# Patient Record
Sex: Female | Born: 1960
Health system: Southern US, Community
[De-identification: ages and names within clinical notes are randomized; demographics above are authoritative.]

## PROBLEM LIST (undated history)

## (undated) DIAGNOSIS — N938 Other specified abnormal uterine and vaginal bleeding: Secondary | ICD-10-CM

## (undated) DIAGNOSIS — J302 Other seasonal allergic rhinitis: Secondary | ICD-10-CM

## (undated) DIAGNOSIS — D219 Benign neoplasm of connective and other soft tissue, unspecified: Secondary | ICD-10-CM

## (undated) DIAGNOSIS — I8390 Asymptomatic varicose veins of unspecified lower extremity: Secondary | ICD-10-CM

## (undated) HISTORY — PX: LIPOMA EXCISION: SHX5283

## (undated) HISTORY — PX: DENTAL SURGERY: SHX609

---

## 1998-03-26 ENCOUNTER — Ambulatory Visit (HOSPITAL_COMMUNITY): Admission: RE | Admit: 1998-03-26 | Discharge: 1998-03-26 | Payer: Self-pay | Admitting: *Deleted

## 1998-04-02 ENCOUNTER — Ambulatory Visit (HOSPITAL_COMMUNITY): Admission: RE | Admit: 1998-04-02 | Discharge: 1998-04-02 | Payer: Self-pay | Admitting: *Deleted

## 1998-12-29 ENCOUNTER — Ambulatory Visit (HOSPITAL_COMMUNITY): Admission: RE | Admit: 1998-12-29 | Discharge: 1998-12-29 | Payer: Self-pay | Admitting: Obstetrics and Gynecology

## 1998-12-29 ENCOUNTER — Encounter: Payer: Self-pay | Admitting: Obstetrics and Gynecology

## 2000-01-27 ENCOUNTER — Other Ambulatory Visit: Admission: RE | Admit: 2000-01-27 | Discharge: 2000-01-27 | Payer: Self-pay | Admitting: Obstetrics and Gynecology

## 2000-02-03 ENCOUNTER — Ambulatory Visit (HOSPITAL_COMMUNITY): Admission: RE | Admit: 2000-02-03 | Discharge: 2000-02-03 | Payer: Self-pay | Admitting: Obstetrics and Gynecology

## 2000-02-03 ENCOUNTER — Encounter: Payer: Self-pay | Admitting: Obstetrics and Gynecology

## 2001-02-28 ENCOUNTER — Other Ambulatory Visit: Admission: RE | Admit: 2001-02-28 | Discharge: 2001-02-28 | Payer: Self-pay | Admitting: Obstetrics and Gynecology

## 2001-03-15 ENCOUNTER — Encounter: Payer: Self-pay | Admitting: Obstetrics and Gynecology

## 2001-03-15 ENCOUNTER — Ambulatory Visit (HOSPITAL_COMMUNITY): Admission: RE | Admit: 2001-03-15 | Discharge: 2001-03-15 | Payer: Self-pay | Admitting: Obstetrics and Gynecology

## 2002-04-05 ENCOUNTER — Other Ambulatory Visit: Admission: RE | Admit: 2002-04-05 | Discharge: 2002-04-05 | Payer: Self-pay | Admitting: Obstetrics and Gynecology

## 2002-04-17 ENCOUNTER — Encounter: Payer: Self-pay | Admitting: Obstetrics and Gynecology

## 2002-04-17 ENCOUNTER — Ambulatory Visit (HOSPITAL_COMMUNITY): Admission: RE | Admit: 2002-04-17 | Discharge: 2002-04-17 | Payer: Self-pay | Admitting: Obstetrics and Gynecology

## 2003-04-10 ENCOUNTER — Other Ambulatory Visit: Admission: RE | Admit: 2003-04-10 | Discharge: 2003-04-10 | Payer: Self-pay | Admitting: Obstetrics and Gynecology

## 2003-04-13 HISTORY — PX: BREAST BIOPSY: SHX20

## 2003-04-23 ENCOUNTER — Encounter: Payer: Self-pay | Admitting: Obstetrics and Gynecology

## 2003-04-23 ENCOUNTER — Ambulatory Visit (HOSPITAL_COMMUNITY): Admission: RE | Admit: 2003-04-23 | Discharge: 2003-04-23 | Payer: Self-pay | Admitting: Obstetrics and Gynecology

## 2003-05-02 ENCOUNTER — Encounter (INDEPENDENT_AMBULATORY_CARE_PROVIDER_SITE_OTHER): Payer: Self-pay | Admitting: *Deleted

## 2003-05-02 ENCOUNTER — Encounter: Payer: Self-pay | Admitting: Obstetrics and Gynecology

## 2003-05-02 ENCOUNTER — Encounter: Admission: RE | Admit: 2003-05-02 | Discharge: 2003-05-02 | Payer: Self-pay | Admitting: Obstetrics and Gynecology

## 2004-03-18 ENCOUNTER — Other Ambulatory Visit: Admission: RE | Admit: 2004-03-18 | Discharge: 2004-03-18 | Payer: Self-pay | Admitting: Obstetrics and Gynecology

## 2004-05-06 ENCOUNTER — Encounter: Admission: RE | Admit: 2004-05-06 | Discharge: 2004-05-06 | Payer: Self-pay | Admitting: Obstetrics and Gynecology

## 2005-06-01 ENCOUNTER — Encounter: Admission: RE | Admit: 2005-06-01 | Discharge: 2005-06-01 | Payer: Self-pay | Admitting: Obstetrics and Gynecology

## 2007-04-25 ENCOUNTER — Encounter: Admission: RE | Admit: 2007-04-25 | Discharge: 2007-04-25 | Payer: Self-pay | Admitting: Obstetrics and Gynecology

## 2009-08-05 ENCOUNTER — Encounter: Admission: RE | Admit: 2009-08-05 | Discharge: 2009-08-05 | Payer: Self-pay | Admitting: Obstetrics and Gynecology

## 2011-01-28 ENCOUNTER — Other Ambulatory Visit: Payer: Self-pay | Admitting: Obstetrics and Gynecology

## 2011-01-28 DIAGNOSIS — Z1231 Encounter for screening mammogram for malignant neoplasm of breast: Secondary | ICD-10-CM

## 2011-02-02 ENCOUNTER — Ambulatory Visit
Admission: RE | Admit: 2011-02-02 | Discharge: 2011-02-02 | Disposition: A | Discharge status: Home / Self Care | Payer: Commercial Managed Care - PPO | Source: Ambulatory Visit | Attending: Obstetrics and Gynecology | Admitting: Obstetrics and Gynecology

## 2011-02-02 DIAGNOSIS — Z1231 Encounter for screening mammogram for malignant neoplasm of breast: Secondary | ICD-10-CM

## 2012-01-24 ENCOUNTER — Encounter (HOSPITAL_COMMUNITY)
Admission: RE | Disposition: A | Discharge status: Home / Self Care | Payer: Self-pay | Source: Ambulatory Visit | Attending: Gastroenterology

## 2012-01-24 ENCOUNTER — Ambulatory Visit (HOSPITAL_COMMUNITY)
Admission: RE | Admit: 2012-01-24 | Discharge: 2012-01-24 | Disposition: A | Discharge status: Home / Self Care | Payer: 59 | Source: Ambulatory Visit | Attending: Gastroenterology | Admitting: Gastroenterology

## 2012-01-24 ENCOUNTER — Encounter (HOSPITAL_COMMUNITY): Payer: Self-pay | Admitting: *Deleted

## 2012-01-24 DIAGNOSIS — K6389 Other specified diseases of intestine: Secondary | ICD-10-CM | POA: Insufficient documentation

## 2012-01-24 DIAGNOSIS — Z1211 Encounter for screening for malignant neoplasm of colon: Secondary | ICD-10-CM | POA: Insufficient documentation

## 2012-01-24 HISTORY — DX: Asymptomatic varicose veins of unspecified lower extremity: I83.90

## 2012-01-24 HISTORY — DX: Benign neoplasm of connective and other soft tissue, unspecified: D21.9

## 2012-01-24 HISTORY — DX: Other seasonal allergic rhinitis: J30.2

## 2012-01-24 HISTORY — PX: COLONOSCOPY: SHX5424

## 2012-01-24 SURGERY — COLONOSCOPY
Anesthesia: Moderate Sedation

## 2012-01-24 MED ORDER — MIDAZOLAM HCL 10 MG/2ML IJ SOLN
INTRAMUSCULAR | Status: AC
  Filled 2012-01-24: qty 4

## 2012-01-24 MED ORDER — FENTANYL CITRATE 0.05 MG/ML IJ SOLN
INTRAMUSCULAR | Status: DC | PRN
  Administered 2012-01-24 (×4): 25 ug via INTRAVENOUS

## 2012-01-24 MED ORDER — SODIUM CHLORIDE 0.9 % IV SOLN
Freq: Once | INTRAVENOUS | Status: AC
  Administered 2012-01-24: 500 mL via INTRAVENOUS

## 2012-01-24 MED ORDER — MIDAZOLAM HCL 10 MG/2ML IJ SOLN
INTRAMUSCULAR | Status: DC | PRN
  Administered 2012-01-24 (×4): 2 mg via INTRAVENOUS

## 2012-01-24 MED ORDER — FENTANYL CITRATE 0.05 MG/ML IJ SOLN
INTRAMUSCULAR | Status: AC
Start: 2012-01-24 — End: 2012-01-24
  Filled 2012-01-24: qty 4

## 2012-01-24 NOTE — Consult Note (Signed)
Reason for Consult: Colorectal cancer screening. Referring Physician: [name] is an 51 y.o. female.  HPI: Patient is here for a colonoscopy. She denies any GI issues at this time.  Past Medical History  Diagnosis Date  . Constipation     one bout constipation recently  . Varicose veins   . Seasonal allergies   . Fibroids     uterine  . GERD (gastroesophageal reflux disease)     distant history gerd no priblems now    Past Surgical History  Procedure Date  . Dental surgery   . Lopoma     left axilla removed    History reviewed. No pertinent family history.  Social History:  does not have a smoking history on file. She does not have any smokeless tobacco history on file. She reports that she drinks alcohol. She reports that she does not use illicit drugs.  Allergies: No Known Allergies  Medications: I have reviewed the patient's current medications.  No results found for this or any previous visit (from the past 48 hour(s)).  No results found.  Review of Systems  Constitutional: Negative for fever, chills, weight loss, malaise/fatigue and diaphoresis.  HENT: Negative for hearing loss, nosebleeds, congestion, sore throat, neck pain and ear discharge.   Eyes: Negative for blurred vision, double vision, photophobia, pain, discharge and redness.  Respiratory: Negative for cough, hemoptysis, sputum production, shortness of breath and wheezing.   Cardiovascular: Negative for chest pain, palpitations and leg swelling.  Gastrointestinal: Negative for heartburn, nausea, vomiting, abdominal pain, diarrhea, constipation, blood in stool and melena.  Genitourinary: Negative for dysuria, urgency, frequency and flank pain.  Musculoskeletal: Negative for myalgias, back pain, joint pain and falls.  Skin: Negative for itching and rash.  Neurological: Negative for dizziness, tingling, tremors, sensory change, speech change, focal weakness, seizures, loss of  consciousness, weakness and headaches.  Endo/Heme/Allergies: Negative for environmental allergies and polydipsia. Does not bruise/bleed easily.  Psychiatric/Behavioral: Negative for depression, suicidal ideas, hallucinations, memory loss and substance abuse. The patient is not nervous/anxious and does not have insomnia.    Blood pressure 121/74, pulse 74, temperature 98.1 F (36.7 C), temperature source Oral, resp. rate 9, height 5' 1.5" (1.562 m), weight 74.844 kg (165 lb), last menstrual period 01/15/2012, SpO2 100.00%. Physical Exam  Constitutional: She is oriented to person, place, and time. She appears well-developed and well-nourished.  HENT:  Head: Normocephalic and atraumatic.  Eyes: Conjunctivae and EOM are normal. Pupils are equal, round, and reactive to light.  Neck: Normal range of motion. Neck supple.  Cardiovascular: Normal rate, regular rhythm and normal heart sounds.   Respiratory: Effort normal and breath sounds normal.  GI: Soft. Bowel sounds are normal.  Musculoskeletal: Normal range of motion.  Neurological: She is alert and oriented to person, place, and time. She has normal reflexes.  Skin: Skin is warm and dry.  Psychiatric: She has a normal mood and affect. Her behavior is normal. Thought content normal.    Assessment/Plan: Colorectal cancer screening: Proceed with a colonoscopy at this time.  Kathleen Donovan 01/24/2012, 7:40 AM

## 2012-01-25 ENCOUNTER — Encounter (HOSPITAL_COMMUNITY): Payer: Self-pay | Admitting: Gastroenterology

## 2012-01-25 NOTE — Op Note (Signed)
[facility] [address] [place], [place]  [address]  OPERATIVE PROCEDURE REPORT  PATIENT:  Kathleen Donovan, Kathleen Donovan  MR#:  [id number] BIRTHDATE:  [birth year]  GENDER:  female ENDOSCOPIST:  Dr. Lorenza Burton, MD ASSISTANT:  Kandice Robinsons and Claudie Revering, RN CGRN.  PROCEDURE DATE:  01/24/2012 PRE-PROCEDURE PREPERATION:  The patient was prepped with 32 oz. of Suprep the night before the procedure and 32 oz. the morning of the procedure.  The patient was fasted for 4 hours prior to the procedure. PRE-PROCEDURE PHYSICAL:  Patient has stable vital signs. Neck is supple. There is no JVD, thyromegaly or LAD. Chest clear to auscultation. S1 and S2 regular. Abdomen soft, non-distended, non-tender with NABS. PROCEDURE:  Diagnostic colonoscopy. ASA CLASS:  Class I INDICATIONS:  1) Colorectal cancer screening, average risk MEDICATIONS:  Fentanyl 100 mcg & Versed 8 mg IV.  DESCRIPTION OF PROCEDURE: After the risks, benefits, and alternatives of the procedure were thoroughly explained [including a 10% missed rate of cancer and polyps], informed consent was obtained.  Digital rectal exam was performed.  The Pentax video colonoscope [id number] was introduced through the anus and advanced to the terminal ileum which was intubated for a short distance, limited by a redundant colon.   The quality of the prep was Suprep fairly good. Multiple washes were done. Small lesions could be missed. The instrument was then slowly withdrawn as the colon was fully examined. <<PROCEDUREIMAGES>>  FINDINGS:  There was mild melanosis coli noted in the left colon. The rest of the colonic mucosa appeared healthy with a normal vascular pattern.  No masses, polyps, diverticula or AVM's were noted. The appendiceal orifice and the ICV were identified and photographed. The terminal ileum appeared normal.  Retroflexed views revealed no abnormalities. The patient tolerated the procedure without immediate  complications. The scope was then withdrawn from the patient and the procedure terminated.  IMPRESSION:  Mild melanosis coli in the left colon; otherwise normal colonoscopy up to the terminal ileum.  RECOMMENDATIONS:  1) Continue surveillance. 2) High fiber diet with liberal fluid intake. 3) OP follow-up is advised on a PRN basis.  REPEAT EXAM:  In 10years; in case the patient has any abnormal GI symptoms in the interim, he should contact the office immediately for further recommendations.  DISCHARGE INSTRUCTIONS:  Standard discharge instructions given.  ______________________________ Dr. Lorenza Burton, MD  CPT CODES: 19147  DIAGNOSIS CODES:  V76.51  CC:  Andi Devon, M.D.  n. eSIGNED:   Dr. Lorenza Burton at 01/24/2012 08:36 AM  Arvid Right, [phone]

## 2012-09-26 ENCOUNTER — Other Ambulatory Visit: Payer: Self-pay | Admitting: Obstetrics and Gynecology

## 2012-09-26 DIAGNOSIS — Z1231 Encounter for screening mammogram for malignant neoplasm of breast: Secondary | ICD-10-CM

## 2012-10-24 ENCOUNTER — Ambulatory Visit: Payer: 59

## 2012-11-28 ENCOUNTER — Ambulatory Visit
Admission: RE | Admit: 2012-11-28 | Discharge: 2012-11-28 | Disposition: A | Discharge status: Home / Self Care | Payer: 59 | Source: Ambulatory Visit | Attending: Obstetrics and Gynecology | Admitting: Obstetrics and Gynecology

## 2012-11-28 DIAGNOSIS — Z1231 Encounter for screening mammogram for malignant neoplasm of breast: Secondary | ICD-10-CM

## 2014-03-20 ENCOUNTER — Other Ambulatory Visit: Payer: Self-pay

## 2014-03-20 DIAGNOSIS — Z1231 Encounter for screening mammogram for malignant neoplasm of breast: Secondary | ICD-10-CM

## 2014-04-09 ENCOUNTER — Encounter (INDEPENDENT_AMBULATORY_CARE_PROVIDER_SITE_OTHER): Payer: Self-pay

## 2014-04-09 ENCOUNTER — Ambulatory Visit
Admission: RE | Admit: 2014-04-09 | Discharge: 2014-04-09 | Disposition: A | Discharge status: Home / Self Care | Payer: 59 | Source: Ambulatory Visit

## 2014-04-09 DIAGNOSIS — Z1231 Encounter for screening mammogram for malignant neoplasm of breast: Secondary | ICD-10-CM

## 2015-05-13 ENCOUNTER — Other Ambulatory Visit: Payer: Self-pay

## 2015-05-13 DIAGNOSIS — Z1231 Encounter for screening mammogram for malignant neoplasm of breast: Secondary | ICD-10-CM

## 2015-06-10 ENCOUNTER — Ambulatory Visit
Admission: RE | Admit: 2015-06-10 | Discharge: 2015-06-10 | Disposition: A | Discharge status: Home / Self Care | Payer: 59 | Source: Ambulatory Visit

## 2015-06-10 DIAGNOSIS — Z1231 Encounter for screening mammogram for malignant neoplasm of breast: Secondary | ICD-10-CM

## 2016-04-07 ENCOUNTER — Other Ambulatory Visit: Payer: Self-pay

## 2016-04-07 DIAGNOSIS — Z1231 Encounter for screening mammogram for malignant neoplasm of breast: Secondary | ICD-10-CM

## 2016-06-22 ENCOUNTER — Ambulatory Visit
Admission: RE | Admit: 2016-06-22 | Discharge: 2016-06-22 | Disposition: A | Discharge status: Home / Self Care | Payer: 59 | Source: Ambulatory Visit

## 2016-06-22 DIAGNOSIS — Z1231 Encounter for screening mammogram for malignant neoplasm of breast: Secondary | ICD-10-CM

## 2016-07-20 DIAGNOSIS — Z0000 Encounter for general adult medical examination without abnormal findings: Secondary | ICD-10-CM | POA: Diagnosis not present

## 2016-07-27 DIAGNOSIS — R87610 Atypical squamous cells of undetermined significance on cytologic smear of cervix (ASC-US): Secondary | ICD-10-CM | POA: Diagnosis not present

## 2016-07-27 DIAGNOSIS — Z01419 Encounter for gynecological examination (general) (routine) without abnormal findings: Secondary | ICD-10-CM | POA: Diagnosis not present

## 2016-07-27 DIAGNOSIS — Z6832 Body mass index (BMI) 32.0-32.9, adult: Secondary | ICD-10-CM | POA: Diagnosis not present

## 2016-07-27 DIAGNOSIS — N952 Postmenopausal atrophic vaginitis: Secondary | ICD-10-CM | POA: Diagnosis not present

## 2016-07-27 DIAGNOSIS — Z124 Encounter for screening for malignant neoplasm of cervix: Secondary | ICD-10-CM | POA: Diagnosis not present

## 2016-07-27 MED FILL — ACYCLOVIR 5% OINTMENT: 5 | 7 days supply | Qty: 15 | Fill #0

## 2017-03-01 DIAGNOSIS — H5212 Myopia, left eye: Secondary | ICD-10-CM | POA: Diagnosis not present

## 2017-03-01 DIAGNOSIS — H524 Presbyopia: Secondary | ICD-10-CM | POA: Diagnosis not present

## 2017-06-21 ENCOUNTER — Other Ambulatory Visit: Payer: Self-pay | Admitting: Obstetrics and Gynecology

## 2017-06-21 DIAGNOSIS — Z1231 Encounter for screening mammogram for malignant neoplasm of breast: Secondary | ICD-10-CM

## 2017-07-05 ENCOUNTER — Ambulatory Visit
Admission: RE | Admit: 2017-07-05 | Discharge: 2017-07-05 | Disposition: A | Discharge status: Home / Self Care | Payer: 59 | Source: Ambulatory Visit | Attending: Obstetrics and Gynecology | Admitting: Obstetrics and Gynecology

## 2017-07-05 DIAGNOSIS — Z1231 Encounter for screening mammogram for malignant neoplasm of breast: Secondary | ICD-10-CM

## 2017-08-09 DIAGNOSIS — Z01411 Encounter for gynecological examination (general) (routine) with abnormal findings: Secondary | ICD-10-CM | POA: Diagnosis not present

## 2017-08-09 DIAGNOSIS — N952 Postmenopausal atrophic vaginitis: Secondary | ICD-10-CM | POA: Diagnosis not present

## 2017-08-09 DIAGNOSIS — N939 Abnormal uterine and vaginal bleeding, unspecified: Secondary | ICD-10-CM | POA: Diagnosis not present

## 2017-08-09 DIAGNOSIS — D259 Leiomyoma of uterus, unspecified: Secondary | ICD-10-CM | POA: Diagnosis not present

## 2017-08-09 DIAGNOSIS — N760 Acute vaginitis: Secondary | ICD-10-CM | POA: Diagnosis not present

## 2017-08-09 DIAGNOSIS — Q506 Other congenital malformations of fallopian tube and broad ligament: Secondary | ICD-10-CM | POA: Diagnosis not present

## 2017-08-09 DIAGNOSIS — Z6832 Body mass index (BMI) 32.0-32.9, adult: Secondary | ICD-10-CM | POA: Diagnosis not present

## 2017-08-30 DIAGNOSIS — Z0000 Encounter for general adult medical examination without abnormal findings: Secondary | ICD-10-CM | POA: Diagnosis not present

## 2017-08-30 DIAGNOSIS — Z1389 Encounter for screening for other disorder: Secondary | ICD-10-CM | POA: Diagnosis not present

## 2017-08-30 DIAGNOSIS — L509 Urticaria, unspecified: Secondary | ICD-10-CM | POA: Diagnosis not present

## 2017-08-31 DIAGNOSIS — R938 Abnormal findings on diagnostic imaging of other specified body structures: Secondary | ICD-10-CM | POA: Diagnosis not present

## 2017-08-31 DIAGNOSIS — N930 Postcoital and contact bleeding: Secondary | ICD-10-CM | POA: Diagnosis not present

## 2017-08-31 DIAGNOSIS — N939 Abnormal uterine and vaginal bleeding, unspecified: Secondary | ICD-10-CM | POA: Diagnosis not present

## 2017-08-31 DIAGNOSIS — D259 Leiomyoma of uterus, unspecified: Secondary | ICD-10-CM | POA: Diagnosis not present

## 2017-10-07 ENCOUNTER — Other Ambulatory Visit: Payer: Self-pay | Admitting: Obstetrics and Gynecology

## 2017-10-19 ENCOUNTER — Other Ambulatory Visit: Payer: Self-pay

## 2017-10-19 ENCOUNTER — Encounter (HOSPITAL_BASED_OUTPATIENT_CLINIC_OR_DEPARTMENT_OTHER): Payer: Self-pay | Admitting: *Deleted

## 2017-10-19 NOTE — Progress Notes (Signed)
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 NEEDS HEMAGLOBIN.

## 2017-10-24 DIAGNOSIS — R9389 Abnormal findings on diagnostic imaging of other specified body structures: Secondary | ICD-10-CM | POA: Diagnosis present

## 2017-10-24 DIAGNOSIS — D251 Intramural leiomyoma of uterus: Secondary | ICD-10-CM | POA: Diagnosis present

## 2017-10-24 DIAGNOSIS — Q524 Other congenital malformations of vagina: Secondary | ICD-10-CM

## 2017-10-24 NOTE — H&P (Signed)
Kathleen Donovan is an 55 y.o. female. She presents for evaluation of thickened endometrium on ultrasound with equivocal serology for menopause and intermittent vaginal bleeding  Pertinent Gynecological History: Menses: irregular spotting Bleeding: post menopausal bleeding Contraception: none DES exposure: denies Blood transfusions: none Sexually transmitted diseases: no past history Previous GYN Procedures: none  Last mammogram: normal Date: 2018 Last pap:  ASCUS neg HR HPV Date: 2017 OB History: G0, P0   Menstrual History: Menarche age: 86 No LMP recorded. Patient is perimenopausal.  Last spotting was after office procedure and then last night with cytotec application  Past Medical History:  Diagnosis Date  . Dysfunctional uterine bleeding   . Fibroids    uterine  . Seasonal allergies   . Varicose veins    BOTH LEGS    Past Surgical History:  Procedure Laterality Date  . BREAST BIOPSY Left 04/2003   U/S Core- Benign  . DENTAL SURGERY    . LIPOMA EXCISION     LEFT AXILLA REGION    History reviewed. No pertinent family history.  Social History:  reports that  has never smoked. she has never used smokeless tobacco. She reports that she drinks alcohol. She reports that she does not use drugs.  Allergies:  Allergies  Allergen Reactions  . Gluten Meal     ITCHING, RASH, STOMACH UPSET  . Latex     ITCHING    Medications Prior to Admission  Medication Sig Dispense Refill Last Dose  . b complex vitamins tablet Take 1 tablet by mouth daily.   Past Week at Unknown time  . cholecalciferol (VITAMIN D) 1000 units tablet Take 1,000 Units daily by mouth.   10/24/2017 at Unknown time  . ibuprofen (ADVIL,MOTRIN) 200 MG tablet Take 600 mg as needed by mouth.   10/24/2017 at Unknown time  . Lactobacillus (PROBIOTIC ACIDOPHILUS PO) Take daily by mouth.   10/24/2017 at Unknown time  . loratadine (CLARITIN) 10 MG tablet Take 10 mg as needed by mouth for allergies.   10/24/2017 at  Unknown time  . Multiple Vitamin (MULTIVITAMIN) capsule Take 1 capsule by mouth every morning.   Past Week at Unknown time  . fish oil-omega-3 fatty acids 1000 MG capsule Take 1 g by mouth daily.   01/23/2012 at 0800  . montelukast (SINGULAIR) 10 MG tablet Take 10 mg by mouth every morning.   Past Week at Unknown    Review of Systems  Constitutional: Negative.   HENT: Positive for tinnitus (low grade and intermittent).   Eyes: Negative.   Respiratory: Negative.   Cardiovascular: Negative.   Gastrointestinal: Negative.   Genitourinary: Negative.   Musculoskeletal: Negative.   Skin: Negative.   Neurological: Negative.   Endo/Heme/Allergies: Positive for environmental allergies.  Psychiatric/Behavioral: Negative.     Blood pressure (!) 141/72, pulse (!) 104, temperature 98.5 F (36.9 C), temperature source Oral, resp. rate 16, height 5' 1.25" (1.556 m), weight 175 lb (79.4 kg), SpO2 100 %. Physical Exam  Constitutional: She is oriented to person, place, and time. She appears well-developed and well-nourished.  HENT:  Head: Atraumatic.  Eyes: EOM are normal.  Neck: Normal range of motion. Neck supple.  Cardiovascular: Normal rate and regular rhythm.  Respiratory: Effort normal and breath sounds normal.  GI: Soft. Bowel sounds are normal.  Neurological: She is alert and oriented to person, place, and time.  Skin: Skin is warm and dry.  Psychiatric: She has a normal mood and affect.   No results found for this or  any previous visit (from the past 2160 hour(s)). No results found for this or any previous visit (from the past 24 hour(s)).  Endometrial biopsy:  neg Assessment/Plan: Pt with intermittent vaginal spotting and equivocal evaluation of menopause on serology showing thickened endometrium on ultasound.  Pt is at risk fo endometrial disease due to nulliparity.  Hysteroscopy D&C recommended with explanation of indication, risks and benefits.  Pt accepts and wishes to  proceed.  Kathleen Donovan P 10/25/2017, 9:46 AM

## 2017-10-25 ENCOUNTER — Ambulatory Visit (HOSPITAL_BASED_OUTPATIENT_CLINIC_OR_DEPARTMENT_OTHER): Payer: 59 | Admitting: Anesthesiology

## 2017-10-25 ENCOUNTER — Encounter (HOSPITAL_BASED_OUTPATIENT_CLINIC_OR_DEPARTMENT_OTHER)
Admission: RE | Disposition: A | Discharge status: Home / Self Care | Payer: Self-pay | Source: Ambulatory Visit | Attending: Obstetrics and Gynecology

## 2017-10-25 ENCOUNTER — Encounter (HOSPITAL_BASED_OUTPATIENT_CLINIC_OR_DEPARTMENT_OTHER): Payer: Self-pay | Admitting: Anesthesiology

## 2017-10-25 ENCOUNTER — Ambulatory Visit (HOSPITAL_BASED_OUTPATIENT_CLINIC_OR_DEPARTMENT_OTHER)
Admission: RE | Admit: 2017-10-25 | Discharge: 2017-10-25 | Disposition: A | Discharge status: Home / Self Care | Payer: 59 | Source: Ambulatory Visit | Attending: Obstetrics and Gynecology | Admitting: Obstetrics and Gynecology

## 2017-10-25 DIAGNOSIS — Z91040 Latex allergy status: Secondary | ICD-10-CM | POA: Diagnosis not present

## 2017-10-25 DIAGNOSIS — Z79899 Other long term (current) drug therapy: Secondary | ICD-10-CM | POA: Insufficient documentation

## 2017-10-25 DIAGNOSIS — N939 Abnormal uterine and vaginal bleeding, unspecified: Secondary | ICD-10-CM | POA: Diagnosis not present

## 2017-10-25 DIAGNOSIS — N858 Other specified noninflammatory disorders of uterus: Secondary | ICD-10-CM | POA: Diagnosis not present

## 2017-10-25 DIAGNOSIS — N840 Polyp of corpus uteri: Secondary | ICD-10-CM | POA: Insufficient documentation

## 2017-10-25 DIAGNOSIS — D259 Leiomyoma of uterus, unspecified: Secondary | ICD-10-CM | POA: Diagnosis not present

## 2017-10-25 DIAGNOSIS — R9389 Abnormal findings on diagnostic imaging of other specified body structures: Secondary | ICD-10-CM | POA: Diagnosis present

## 2017-10-25 DIAGNOSIS — Z888 Allergy status to other drugs, medicaments and biological substances status: Secondary | ICD-10-CM | POA: Insufficient documentation

## 2017-10-25 DIAGNOSIS — Q524 Other congenital malformations of vagina: Secondary | ICD-10-CM

## 2017-10-25 DIAGNOSIS — N8500 Endometrial hyperplasia, unspecified: Secondary | ICD-10-CM | POA: Diagnosis not present

## 2017-10-25 DIAGNOSIS — D251 Intramural leiomyoma of uterus: Secondary | ICD-10-CM | POA: Diagnosis present

## 2017-10-25 HISTORY — PX: DILATATION & CURRETTAGE/HYSTEROSCOPY WITH RESECTOCOPE: SHX5572

## 2017-10-25 HISTORY — DX: Other specified abnormal uterine and vaginal bleeding: N93.8

## 2017-10-25 SURGERY — DILATATION & CURETTAGE/HYSTEROSCOPY WITH RESECTOCOPE
Anesthesia: General | Site: Vagina

## 2017-10-25 MED ORDER — LIDOCAINE 2% (20 MG/ML) 5 ML SYRINGE
INTRAMUSCULAR | Status: AC
  Filled 2017-10-25: qty 5

## 2017-10-25 MED ORDER — ONDANSETRON HCL 4 MG/2ML IJ SOLN
INTRAMUSCULAR | Status: AC
  Filled 2017-10-25: qty 2

## 2017-10-25 MED ORDER — ONDANSETRON HCL 4 MG/2ML IJ SOLN
4.0000 mg | Freq: Once | INTRAMUSCULAR | Status: DC | PRN
  Filled 2017-10-25: qty 2

## 2017-10-25 MED ORDER — LIDOCAINE HCL 2 % IJ SOLN
INTRAMUSCULAR | Status: DC | PRN
  Administered 2017-10-25: 10 mL

## 2017-10-25 MED ORDER — ARTIFICIAL TEARS OPHTHALMIC OINT
TOPICAL_OINTMENT | OPHTHALMIC | Status: AC
  Filled 2017-10-25: qty 3.5

## 2017-10-25 MED ORDER — MIDAZOLAM HCL 2 MG/2ML IJ SOLN
INTRAMUSCULAR | Status: AC
  Filled 2017-10-25: qty 2

## 2017-10-25 MED ORDER — IBUPROFEN 600 MG PO TABS: ORAL_TABLET | ORAL | 0 refills | Status: DC

## 2017-10-25 MED ORDER — MIDAZOLAM HCL 5 MG/5ML IJ SOLN
INTRAMUSCULAR | Status: DC | PRN
  Administered 2017-10-25: 2 mg via INTRAVENOUS

## 2017-10-25 MED ORDER — DEXAMETHASONE SODIUM PHOSPHATE 10 MG/ML IJ SOLN
INTRAMUSCULAR | Status: AC
  Filled 2017-10-25: qty 1

## 2017-10-25 MED ORDER — LIDOCAINE 2% (20 MG/ML) 5 ML SYRINGE
INTRAMUSCULAR | Status: DC | PRN
  Administered 2017-10-25: 60 mg via INTRAVENOUS

## 2017-10-25 MED ORDER — MEPERIDINE HCL 25 MG/ML IJ SOLN
6.2500 mg | INTRAMUSCULAR | Status: DC | PRN
  Filled 2017-10-25: qty 1

## 2017-10-25 MED ORDER — LACTATED RINGERS IV SOLN
INTRAVENOUS | Status: DC
  Administered 2017-10-25 (×2): via INTRAVENOUS
  Filled 2017-10-25: qty 1000

## 2017-10-25 MED ORDER — FENTANYL CITRATE (PF) 100 MCG/2ML IJ SOLN
INTRAMUSCULAR | Status: DC | PRN
  Administered 2017-10-25 (×2): 25 ug via INTRAVENOUS
  Administered 2017-10-25: 50 ug via INTRAVENOUS

## 2017-10-25 MED ORDER — KETOROLAC TROMETHAMINE 30 MG/ML IJ SOLN
INTRAMUSCULAR | Status: DC | PRN
  Administered 2017-10-25: 30 mg via INTRAMUSCULAR
  Administered 2017-10-25: 30 mg via INTRAVENOUS

## 2017-10-25 MED ORDER — KETOROLAC TROMETHAMINE 30 MG/ML IJ SOLN
INTRAMUSCULAR | Status: AC
  Filled 2017-10-25: qty 2

## 2017-10-25 MED ORDER — SODIUM CHLORIDE 0.9 % IR SOLN
Status: DC | PRN
  Administered 2017-10-25: 3000 mL

## 2017-10-25 MED ORDER — PROPOFOL 10 MG/ML IV BOLUS
INTRAVENOUS | Status: AC
  Filled 2017-10-25: qty 20

## 2017-10-25 MED ORDER — FENTANYL CITRATE (PF) 100 MCG/2ML IJ SOLN
INTRAMUSCULAR | Status: AC
  Filled 2017-10-25: qty 2

## 2017-10-25 MED ORDER — DEXAMETHASONE SODIUM PHOSPHATE 4 MG/ML IJ SOLN
INTRAMUSCULAR | Status: DC | PRN
  Administered 2017-10-25: 10 mg via INTRAVENOUS

## 2017-10-25 MED ORDER — ONDANSETRON HCL 4 MG/2ML IJ SOLN
INTRAMUSCULAR | Status: DC | PRN
  Administered 2017-10-25: 4 mg via INTRAVENOUS

## 2017-10-25 MED ORDER — PROPOFOL 10 MG/ML IV BOLUS
INTRAVENOUS | Status: DC | PRN
  Administered 2017-10-25: 160 mg via INTRAVENOUS

## 2017-10-25 MED ORDER — HYDROMORPHONE HCL 1 MG/ML IJ SOLN
0.2500 mg | INTRAMUSCULAR | Status: DC | PRN
  Filled 2017-10-25: qty 0.5

## 2017-10-25 SURGICAL SUPPLY — 24 items
BIPOLAR CUTTING LOOP 21FR (ELECTRODE)
CANISTER SUCT 3000ML PPV (MISCELLANEOUS) ×2 IMPLANT
CATH FOLEY 2WAY  5CC 16FR SIL (CATHETERS) ×1
CATH FOLEY 2WAY 5CC 16FR SIL (CATHETERS) IMPLANT
CATH ROBINSON RED A/P 16FR (CATHETERS) ×1 IMPLANT
CLOTH BEACON ORANGE TIMEOUT ST (SAFETY) ×2 IMPLANT
COUNTER NEEDLE 1200 MAGNETIC (NEEDLE) ×1 IMPLANT
CURETTE PIPELLE ENDOMTRL SUCTN (MISCELLANEOUS) IMPLANT
DILATOR CANAL MILEX (MISCELLANEOUS) IMPLANT
GLOVE BIOGEL PI IND STRL 7.0 (GLOVE) IMPLANT
GLOVE BIOGEL PI INDICATOR 7.0 (GLOVE) ×1
GLOVE INDICATOR 7.0 STRL GRN (GLOVE) ×1 IMPLANT
GLOVE SURG SS PI 6.5 STRL IVOR (GLOVE) ×3 IMPLANT
GLOVE SURG SS PI 7.0 STRL IVOR (GLOVE) ×1 IMPLANT
GOWN STRL REUS W/TWL LRG LVL3 (GOWN DISPOSABLE) ×4 IMPLANT
KIT RM TURNOVER CYSTO AR (KITS) ×2 IMPLANT
LOOP CUTTING BIPOLAR 21FR (ELECTRODE) IMPLANT
PACK VAGINAL MINOR WOMEN LF (CUSTOM PROCEDURE TRAY) ×2 IMPLANT
PAD OB MATERNITY 4.3X12.25 (PERSONAL CARE ITEMS) ×2 IMPLANT
PIPELLE ENDOMETRIAL SUCTION CU (MISCELLANEOUS)
TOWEL OR 17X24 6PK STRL BLUE (TOWEL DISPOSABLE) ×4 IMPLANT
TUBING AQUILEX INFLOW (TUBING) ×2 IMPLANT
TUBING AQUILEX OUTFLOW (TUBING) ×2 IMPLANT
WATER STERILE IRR 500ML POUR (IV SOLUTION) ×2 IMPLANT

## 2017-10-25 NOTE — Anesthesia Preprocedure Evaluation (Signed)
Anesthesia Evaluation  Patient identified by MRN, date of birth, ID band Patient awake    Reviewed: Allergy & Precautions, NPO status , Patient's Chart, lab work & pertinent test results  Airway Mallampati: I  TM Distance: >3 FB Neck ROM: Full    Dental   Pulmonary    Pulmonary exam normal        Cardiovascular Normal cardiovascular exam     Neuro/Psych    GI/Hepatic   Endo/Other    Renal/GU      Musculoskeletal   Abdominal   Peds  Hematology   Anesthesia Other Findings   Reproductive/Obstetrics                             Anesthesia Physical Anesthesia Plan  ASA: II  Anesthesia Plan: General   Post-op Pain Management:    Induction: Intravenous  PONV Risk Score and Plan: 3 and Dexamethasone, Ondansetron, Treatment may vary due to age or medical condition and Midazolam  Airway Management Planned: LMA  Additional Equipment:   Intra-op Plan:   Post-operative Plan: Extubation in OR  Informed Consent: I have reviewed the patients History and Physical, chart, labs and discussed the procedure including the risks, benefits and alternatives for the proposed anesthesia with the patient or authorized representative who has indicated his/her understanding and acceptance.     Plan Discussed with: CRNA and Surgeon  Anesthesia Plan Comments:         Anesthesia Quick Evaluation

## 2017-10-25 NOTE — Anesthesia Procedure Notes (Signed)
Procedure Name: LMA Insertion Date/Time: 10/25/2017 9:57 AM Performed by: Lillia Abed, MD Pre-anesthesia Checklist: Patient identified, Emergency Drugs available, Suction available and Patient being monitored Patient Re-evaluated:Patient Re-evaluated prior to induction Oxygen Delivery Method: Circle system utilized Preoxygenation: Pre-oxygenation with 100% oxygen Induction Type: IV induction Ventilation: Mask ventilation without difficulty LMA: LMA inserted LMA Size: 4.0 Number of attempts: 1 Airway Equipment and Method: Bite block Placement Confirmation: positive ETCO2 Tube secured with: Tape Dental Injury: Teeth and Oropharynx as per pre-operative assessment

## 2017-10-25 NOTE — Op Note (Signed)
10/25/2017  10:30 AM  PATIENT:  Kathleen Donovan  56 y.o. female  PRE-OPERATIVE DIAGNOSIS:  Endometrial thickening on ultrasound with abnormal uterine bleeding and uterine fibroids  POST-OPERATIVE DIAGNOSIS:  Endometrial   thickening on ultrasound with abnormal uterine bleeding and uterine fibroids  PROCEDURE:  Procedure(s): DILATATION & CURETTAGE/HYSTEROSCOPY  SURGEON:  HAYGOOD,VANESSA P, MD  ASSISTANTS: None  ANESTHESIA:   general  ESTIMATED BLOOD LOSS: Minimal, less than 10 cc   BLOOD ADMINISTERED:none  COMPLICATIONS: None  FINDINGS: The uterus sounded to 7 cm. There were several polypoid lesions noted at hysteroscopy. The remainder of the endometrium contains shaggy lining. Both tubal ostia were identified.  FLUID DEFICIT: 150 cc  LOCAL MEDICATIONS USED:  XYLOCAINE  10cc   SPECIMEN:  Source of Specimen:  #1. Endometrial polyp. #2. Endometrial curettings  DISPOSITION OF SPECIMEN:  PATHOLOGY  COUNTS:  YES  DESCRIPTION OF PROCEDURE:the patient was taken to the operating room after appropriate identification and placed on the operating table. After the attainment of adequate general anesthesia she was placed in the lithotomy position. A timeout was performed.  . The perineum and vagina were prepped with multiple layers of Betadine. The bladder was emptied with a an in and out catheter. The perineum was draped in sterile field. A gray speculum was placed in the vagina. The cervix was grasped with a single-tooth tenaculum. A paracervical block was achieved with a total of 10 cc of 2% Xylocaine and the 5 and 7:00 positions. The uterus was sounded.  The cervix was then dilated to accommodate the diagnostic hysteroscope. The hysteroscope was used to evaluate all quadrants of the uterus with the above-noted findings. A Randall stone forceps was used to remove the polypoid lesions and they were sent as a separate specimen. An endometrial curet was used to curet all quadrants of the  uterus with the production of a small to moderate amount of tissue. The hysteroscope was reinserted to document no further lesions.All instruments were then removed from the vagina and the patient was awakened from general anesthesia and taken to the recovery room in satisfactory condition having tolerated the procedure well sponge and instrument counts correct.  ketorolac 30 mg IV and 30 mg IM were given during the procedure.  PLAN OF CARE: Discharge home after postanesthesia care   PATIENT DISPOSITION:  PACU - hemodynamically stable.   Delay start of Pharmacological VTE agent (>24hrs) due to surgical blood loss or risk of bleeding:  yes   SCDs were used during this entire procedure   HAYGOOD,VANESSA P, MD 10:30 AM

## 2017-10-25 NOTE — Discharge Instructions (Signed)
Hysteroscopy, Care After °Refer to this sheet in the next few weeks. These instructions provide you with information on caring for yourself after your procedure. Your health care provider may also give you more specific instructions. Your treatment has been planned according to current medical practices, but problems sometimes occur. Call your health care provider if you have any problems or questions after your procedure. °What can I expect after the procedure? °After your procedure, it is typical to have the following: °· You may have some cramping. This normally lasts for a couple days. °· You may have bleeding. This can vary from light spotting for a few days to menstrual-like bleeding for 3-7 days. ° °Follow these instructions at home: °· Rest for the first 1-2 days after the procedure. °· Only take over-the-counter or prescription medicines as directed by your health care provider. Do not take aspirin. It can increase the chances of bleeding. °· Take showers instead of baths for 2 weeks or as directed by your health care provider. °· Do not drive for 24 hours or as directed. °· Do not drink alcohol while taking pain medicine. °· Do not use tampons, douche, or have sexual intercourse for 2 weeks or until your health care provider says it is okay. °· Take your temperature twice a day for 4-5 days. Write it down each time. °· Follow your health care provider's advice about diet, exercise, and lifting. °· If you develop constipation, you may: °? Take a mild laxative if your health care provider approves. °? Add bran foods to your diet. °? Drink enough fluids to keep your urine clear or pale yellow. °· Try to have someone with you or available to you for the first 24-48 hours, especially if you were given a general anesthetic. °· Follow up with your health care provider as directed. °Contact a health care provider if: °· You feel dizzy or lightheaded. °· You feel sick to your stomach (nauseous). °· You have  abnormal vaginal discharge. °· You have a rash. °· You have pain that is not controlled with medicine. °Get help right away if: °· You have bleeding that is heavier than a normal menstrual period. °· You have a fever. °· You have increasing cramps or pain, not controlled with medicine. °· You have new belly (abdominal) pain. °· You pass out. °· You have pain in the tops of your shoulders (shoulder strap areas). °· You have shortness of breath. °This information is not intended to replace advice given to you by your health care provider. Make sure you discuss any questions you have with your health care provider. °Document Released: 09/19/2013 Document Revised: 05/06/2016 Document Reviewed: 06/28/2013 °Elsevier Interactive Patient Education © 2017 [employer] ° ° °Post Anesthesia Home Care Instructions ° °Activity: °Get plenty of rest for the remainder of the day. A responsible individual must stay with you for 24 hours following the procedure.  °For the next 24 hours, DO NOT: °-Drive a car °-Operate machinery °-Drink alcoholic beverages °-Take any medication unless instructed by your physician °-Make any legal decisions or sign important papers. ° °Meals: °Start with liquid foods such as gelatin or soup. Progress to regular foods as tolerated. Avoid greasy, spicy, heavy foods. If nausea and/or vomiting occur, drink only clear liquids until the nausea and/or vomiting subsides. Call your physician if vomiting continues. ° °Special Instructions/Symptoms: °Your throat may feel dry or sore from the anesthesia or the breathing tube placed in your throat during surgery. If this causes discomfort, gargle   with warm salt water. The discomfort should disappear within 24 hours. ° °If you had a scopolamine patch placed behind your ear for the management of post- operative nausea and/or vomiting: ° °1. The medication in the patch is effective for 72 hours, after which it should be removed.  Wrap patch in a tissue and discard in  the trash. Wash hands thoroughly with soap and water. °2. You may remove the patch earlier than 72 hours if you experience unpleasant side effects which may include dry mouth, dizziness or visual disturbances. °3. Avoid touching the patch. Wash your hands with soap and water after contact with the patch. °  ° ° °

## 2017-10-25 NOTE — Transfer of Care (Signed)
  Last Vitals:  Vitals:   10/25/17 0821 10/25/17 1041  BP: (!) 141/72   Pulse: (!) 104   Resp: 16   Temp: 36.9 C (P) 36.5 C  SpO2: 100% (P) 100%    Last Pain:  Vitals:   10/25/17 0834  TempSrc:   PainSc: 1       Patients Stated Pain Goal: 5 (10/25/17 9476)  Immediate Anesthesia Transfer of Care Note  Patient: Kathleen Donovan  Procedure(s) Performed: Procedure(s) (LRB): DILATATION & CURETTAGE/HYSTEROSCOPY (N/A)  Patient Location: PACU  Anesthesia Type: General  Level of Consciousness: awake, alert  and oriented  Airway & Oxygen Therapy: Patient Spontanous Breathing and Patient connected to nasal cannula oxygen  Post-op Assessment: Report given to PACU RN and Post -op Vital signs reviewed and stable  Post vital signs: Reviewed and stable  Complications: No apparent anesthesia complications

## 2017-10-26 ENCOUNTER — Encounter (HOSPITAL_BASED_OUTPATIENT_CLINIC_OR_DEPARTMENT_OTHER): Payer: Self-pay | Admitting: Obstetrics and Gynecology

## 2017-10-26 LAB — POCT HEMOGLOBIN-HEMACUE: Hemoglobin: 13.3 g/dL (ref 12.0–15.0)

## 2017-10-26 NOTE — Anesthesia Postprocedure Evaluation (Signed)
Anesthesia Post Note  Patient: BRONDA ALFRED  Procedure(s) Performed: DILATATION & CURETTAGE/HYSTEROSCOPY (N/A Vagina )     Patient location during evaluation: PACU Anesthesia Type: General Level of consciousness: awake and alert Pain management: pain level controlled Vital Signs Assessment: post-procedure vital signs reviewed and stable Respiratory status: spontaneous breathing, nonlabored ventilation, respiratory function stable and patient connected to nasal cannula oxygen Cardiovascular status: blood pressure returned to baseline and stable Postop Assessment: no apparent nausea or vomiting Anesthetic complications: no    Last Vitals:  Vitals:   10/25/17 1115 10/25/17 1215  BP: 111/70 121/70  Pulse: 80 73  Resp: (!) 22 20  Temp:  36.5 C  SpO2: 100% 100%    Last Pain:  Vitals:   10/25/17 1100  TempSrc:   PainSc: 0-No pain                 Naijah Lacek DAVID

## 2018-06-20 ENCOUNTER — Other Ambulatory Visit: Payer: Self-pay | Admitting: Obstetrics and Gynecology

## 2018-06-20 DIAGNOSIS — Z1231 Encounter for screening mammogram for malignant neoplasm of breast: Secondary | ICD-10-CM

## 2018-07-12 ENCOUNTER — Ambulatory Visit
Admission: RE | Admit: 2018-07-12 | Discharge: 2018-07-12 | Disposition: A | Discharge status: Home / Self Care | Payer: 59 | Source: Ambulatory Visit | Attending: Obstetrics and Gynecology | Admitting: Obstetrics and Gynecology

## 2018-07-12 DIAGNOSIS — Z1231 Encounter for screening mammogram for malignant neoplasm of breast: Secondary | ICD-10-CM | POA: Diagnosis not present

## 2018-08-16 DIAGNOSIS — R87618 Other abnormal cytological findings on specimens from cervix uteri: Secondary | ICD-10-CM | POA: Diagnosis not present

## 2018-08-16 DIAGNOSIS — N939 Abnormal uterine and vaginal bleeding, unspecified: Secondary | ICD-10-CM | POA: Diagnosis not present

## 2018-08-16 DIAGNOSIS — Z01411 Encounter for gynecological examination (general) (routine) with abnormal findings: Secondary | ICD-10-CM | POA: Diagnosis not present

## 2018-08-16 DIAGNOSIS — Z6832 Body mass index (BMI) 32.0-32.9, adult: Secondary | ICD-10-CM | POA: Diagnosis not present

## 2018-08-16 DIAGNOSIS — D259 Leiomyoma of uterus, unspecified: Secondary | ICD-10-CM | POA: Diagnosis not present

## 2018-08-16 DIAGNOSIS — Z124 Encounter for screening for malignant neoplasm of cervix: Secondary | ICD-10-CM | POA: Diagnosis not present

## 2018-08-22 ENCOUNTER — Inpatient Hospital Stay: Payer: 59 | Attending: Gynecology | Admitting: Gynecology

## 2018-08-22 ENCOUNTER — Encounter: Payer: Self-pay | Admitting: Gynecology

## 2018-08-22 VITALS — BP 122/72 | HR 86 | Temp 98.4°F | Resp 20 | Ht 61.5 in | Wt 172.7 lb

## 2018-08-22 DIAGNOSIS — N950 Postmenopausal bleeding: Secondary | ICD-10-CM | POA: Diagnosis not present

## 2018-08-22 DIAGNOSIS — N939 Abnormal uterine and vaginal bleeding, unspecified: Secondary | ICD-10-CM

## 2018-08-22 DIAGNOSIS — D259 Leiomyoma of uterus, unspecified: Secondary | ICD-10-CM | POA: Insufficient documentation

## 2018-08-22 DIAGNOSIS — Z8041 Family history of malignant neoplasm of ovary: Secondary | ICD-10-CM | POA: Insufficient documentation

## 2018-08-22 NOTE — Patient Instructions (Signed)
Plan to have an MRI of the pelvis with and without contrast to further evaluate your ovaries, the uterus, the endometrium.  We will contact you with the results and recommendations.  Please call for any questions or concerns.

## 2018-08-22 NOTE — Progress Notes (Signed)
Consult Note: Gyn-Onc   SHANIKQUA ZARZYCKI 57 y.o. female  Chief Complaint  Patient presents with  . Abnormal uterine bleeding (AUB)    Assessment : Post menopausal bleeding.  History of uterine fibroids.  Family history of ovarian cancer.  The etiology of the patient's postmenopausal bleeding is uncertain.  It could be associated with her uterine fibroids or she may have some other uterine pathology such as a sarcoma or endometrial polyp.  (I contacted Dr. Kendall Flack by phone and reviewed the patient's signs and symptoms to be certain that I was not overlooking any issues)  Plan: I think it is important to further evaluate the pelvis and would recommend an MRI with and without contrast to further delineate the uterus and the characteristics of the fibroids.  Further we would evaluate the endometrial stripe as well as the ovaries.  Follow results of the MRI I will consult with Dr. Leo Grosser to develop a more focused management plan.  I did indicate to the patient that hysterectomy and bilateral salpingo-oophorectomy may be appropriate.  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 Kendall Flack regarding management of postmenopausal bleeding.  Patient reports that she had symptoms of menopause including hot flushes and 9 months of amenorrhea when she was 71.  Subsequently, she has had intermittent bleeding sometimes bright red.  Patient is been evaluated previously and has had a normal Pap smear (ASCUS, high risk HPV negative) and in November 2018 underwent ultrasound showing uterine fibroids.  She then underwent hysteroscopy and a D&C revealing tubal metaplasia.  No hyperplasia or malignancy was found.  Subsequently the patient continued to have irregular bleeding.  She has occasional right lower quadrant pain which is very intermittent.  Otherwise she denies any other pelvic symptoms.  She denies any abdominal bloating  decreased appetite or abdominal pressure.  Functional status is good.  The patient uses estradiol vaginal cream on an irregular basis.  She is not taking any oral hormonal medication.  She has a family history of ovarian cancer in her mother who died at age 72.  There are no other immediate family members with ovarian or breast cancer.  Her father had prostate cancer.  Review of Systems:10 point review of systems is negative except as noted in interval history.   Vitals: Blood pressure 122/72, pulse 86, temperature 98.4 F (36.9 C), temperature source Oral, resp. rate 20, height 5' 1.5" (1.562 m), weight 172 lb 11.2 oz (78.3 kg), last menstrual period 01/15/2012, SpO2 100 %.  Physical Exam: General : The patient is a healthy woman in no acute distress.  HEENT: normocephalic, extraoccular movements normal; neck is supple without thyromegally  Lynphnodes: Supraclavicular and inguinal nodes not enlarged  Abdomen: Soft, non-tender, no ascites, no organomegally, no masses, no hernias  Pelvic:  EGBUS: Normal female  Vagina: Normal, there is a 3 cm Gartner's duct cyst on the right lateral vaginal sidewall. Urethra and Bladder: Normal, non-tender  Cervix: Peers normal but there is blood coming from the cervical os. Uterus: Difficult to outline secondary to the patient's habitus.  It does not seem to be extremely enlarged. Bi-manual examination: Non-tender; no adenxal masses or nodularity    Lower extremities: No edema or varicosities. Normal range of motion      Allergies  Allergen Reactions  . Gluten Meal     ITCHING, RASH, STOMACH UPSET  . Latex     ITCHING    Past Medical History:  Diagnosis  Date  . Dysfunctional uterine bleeding   . Fibroids    uterine  . Seasonal allergies   . Varicose veins    BOTH LEGS    Past Surgical History:  Procedure Laterality Date  . BREAST BIOPSY Left 04/2003   U/S Core- Benign  . COLONOSCOPY  01/24/2012   Procedure: COLONOSCOPY;  Surgeon:  Juanita Craver, MD;  Location: [facility];  Service: Endoscopy;  Laterality: N/A;  . DENTAL SURGERY    . DILATATION & CURRETTAGE/HYSTEROSCOPY WITH RESECTOCOPE N/A 10/25/2017   Procedure: DILATATION & CURETTAGE/HYSTEROSCOPY;  Surgeon: Eldred Manges, MD;  Location: [facility];  Service: Gynecology;  Laterality: N/A;  . LIPOMA EXCISION     LEFT AXILLA REGION    Current Outpatient Medications  Medication Sig Dispense Refill  . cholecalciferol (VITAMIN D) 1000 units tablet Take 2,000 Units by mouth daily.     Marland Kitchen ibuprofen (ADVIL,MOTRIN) 600 MG tablet 1 tablet orally every 6 hours for 3 days and then 1 tablet orally every 6 hours as needed for pain 60 tablet 0  . Lactobacillus (PROBIOTIC ACIDOPHILUS PO) Take daily by mouth.    . loratadine (CLARITIN) 10 MG tablet Take 10 mg as needed by mouth for allergies.    . Multiple Vitamin (MULTIVITAMIN) capsule Take 1 capsule by mouth every morning.     No current facility-administered medications for this visit.     Social History   Socioeconomic History  . Marital status: Divorced    Spouse name: Not on file  . Number of children: Not on file  . Years of education: Not on file  . Highest education level: Not on file  Occupational History  . Not on file  Social Needs  . Financial resource strain: Not on file  . Food insecurity:    Worry: Not on file    Inability: Not on file  . Transportation needs:    Medical: Not on file    Non-medical: Not on file  Tobacco Use  . Smoking status: Never Smoker  . Smokeless tobacco: Never Used  Substance and Sexual Activity  . Alcohol use: Yes    Comment: rarely  . Drug use: No  . Sexual activity: Never  Lifestyle  . Physical activity:    Days per week: Not on file    Minutes per session: Not on file  . Stress: Not on file  Relationships  . Social connections:    Talks on phone: Not on file    Gets together: Not on file    Attends religious service: Not on file    Active  member of club or organization: Not on file    Attends meetings of clubs or organizations: Not on file    Relationship status: Not on file  . Intimate partner violence:    Fear of current or ex partner: Not on file    Emotionally abused: Not on file    Physically abused: Not on file    Forced sexual activity: Not on file  Other Topics Concern  . Not on file  Social History Narrative  . Not on file    Family History  Problem Relation Age of Onset  . Ovarian cancer Mother   . Prostate cancer Father   . Hypertension Sister   . Asthma Sister   . Obesity Sister       Marti Sleigh, MD 08/22/2018, 9:38 AM

## 2018-08-29 ENCOUNTER — Ambulatory Visit (HOSPITAL_COMMUNITY)
Admission: RE | Admit: 2018-08-29 | Discharge: 2018-08-29 | Disposition: A | Discharge status: Home / Self Care | Payer: 59 | Source: Ambulatory Visit | Attending: Gynecologic Oncology | Admitting: Gynecologic Oncology

## 2018-08-29 DIAGNOSIS — D259 Leiomyoma of uterus, unspecified: Secondary | ICD-10-CM | POA: Diagnosis not present

## 2018-08-29 DIAGNOSIS — N939 Abnormal uterine and vaginal bleeding, unspecified: Secondary | ICD-10-CM | POA: Diagnosis not present

## 2018-08-29 DIAGNOSIS — N800 Endometriosis of uterus: Secondary | ICD-10-CM | POA: Insufficient documentation

## 2018-08-29 DIAGNOSIS — Q524 Other congenital malformations of vagina: Secondary | ICD-10-CM | POA: Insufficient documentation

## 2018-08-29 MED ORDER — GADOBUTROL 1 MMOL/ML IV SOLN
7.5000 mL | Freq: Once | INTRAVENOUS | Status: AC | PRN
  Administered 2018-08-29: 7 mL via INTRAVENOUS

## 2018-08-30 DIAGNOSIS — Z0000 Encounter for general adult medical examination without abnormal findings: Secondary | ICD-10-CM | POA: Insufficient documentation

## 2018-08-30 LAB — HEPATIC FUNCTION PANEL
ALT: 14 (ref 7–35)
AST: 21 (ref 13–35)
Alkaline Phosphatase: 59 (ref 25–125)
Bilirubin, Total: 0.5

## 2018-08-30 LAB — CBC AND DIFFERENTIAL
HCT: 43 (ref 36–46)
Hemoglobin: 14 (ref 12.0–16.0)
PLATELETS: 301 (ref 150–399)
WBC: 4.4

## 2018-08-30 LAB — HEMOGLOBIN A1C: Hemoglobin A1C: 5.4

## 2018-08-30 LAB — BASIC METABOLIC PANEL
BUN: 13 (ref 4–21)
Creatinine: 1 (ref 0.5–1.1)
GLUCOSE: 87
Potassium: 4.7 (ref 3.4–5.3)
Sodium: 142 (ref 137–147)

## 2018-08-30 LAB — LIPID PANEL
Cholesterol: 191 (ref 0–200)
HDL: 81 — AB (ref 35–70)
LDL CALC: 98
LDl/HDL Ratio: 1.2
Triglycerides: 89 (ref 40–160)

## 2018-08-30 LAB — VITAMIN D 25 HYDROXY (VIT D DEFICIENCY, FRACTURES): Vit D, 25-Hydroxy: 47.4

## 2018-09-05 ENCOUNTER — Telehealth: Payer: Self-pay

## 2018-09-05 NOTE — Telephone Encounter (Signed)
Told Kathleen Donovan that the endometrium is thick and an endometrial biopsy is recommended. Dr Fermin Schwab spoke with Dr. Leo Grosser and her office should be contacting her to set up an appointment.  If she has not heard from the office in a week, She needs to contact office to schedule. Pt verbalizes understanding.

## 2018-09-30 ENCOUNTER — Encounter: Payer: Self-pay | Admitting: Internal Medicine

## 2018-09-30 DIAGNOSIS — Z0000 Encounter for general adult medical examination without abnormal findings: Secondary | ICD-10-CM

## 2018-10-03 ENCOUNTER — Encounter: Payer: Self-pay | Admitting: Internal Medicine

## 2018-10-03 ENCOUNTER — Ambulatory Visit: Payer: 59 | Admitting: Internal Medicine

## 2018-10-03 VITALS — BP 118/76 | HR 89 | Temp 98.9°F | Ht 60.5 in | Wt 172.4 lb

## 2018-10-03 DIAGNOSIS — Z0000 Encounter for general adult medical examination without abnormal findings: Secondary | ICD-10-CM

## 2018-10-03 LAB — POCT URINALYSIS DIPSTICK
Bilirubin, UA: NEGATIVE
Glucose, UA: NEGATIVE
Ketones, UA: NEGATIVE
LEUKOCYTES UA: NEGATIVE
Nitrite, UA: NEGATIVE
PH UA: 7 (ref 5.0–8.0)
Protein, UA: NEGATIVE
Spec Grav, UA: 1.01 (ref 1.010–1.025)
UROBILINOGEN UA: 0.2 U/dL

## 2018-10-03 NOTE — Progress Notes (Signed)
Patient's last menstrual period was 01/15/2012..  Negative for: breast discharge, breast lump(s), breast pain and breast self exam. Associated symptoms include abnormal vaginal bleeding. Pertinent negatives include abnormal bleeding (hematology), anxiety, decreased libido, depression, difficulty falling sleep, dyspareunia, history of infertility, nocturia, sexual dysfunction, sleep disturbances, urinary incontinence, urinary urgency, vaginal discharge and vaginal itching. Diet regular.The patient states her exercise level is    . The patient's tobacco use is:  Social History   Tobacco Use  Smoking Status Never Smoker  Smokeless Tobacco Never Used  . She has been exposed to passive smoke. The patient's alcohol use is:  Social History   Substance and Sexual Activity  Alcohol Use Yes   Comment: rarely   She is followed by Dr. Leo Grosser for her gyn care. She was last seen Sept 4, 2019. She had mammogram performed at [facility] on 07/12/2018.     Subjective:     Patient ID: Kathleen Donovan , female    DOB: 1961/07/27 , 57 y.o.   MRN: [id number]   She is here today for a full physical examination.  She is followed by Dr. Leo Grosser for her gyn exams. She had her last colonoscopy in 2013.      Past Medical History:  Diagnosis Date  . Dysfunctional uterine bleeding   . Fibroids    uterine  . Seasonal allergies   . Varicose veins    BOTH LEGS      Current Outpatient Medications:  .  cholecalciferol (VITAMIN D) 1000 units tablet, Take 2,000 Units by mouth daily. , Disp: , Rfl:  .  Lactobacillus (PROBIOTIC ACIDOPHILUS PO), Take daily by mouth., Disp: , Rfl:  .  loratadine (CLARITIN) 10 MG tablet, Take 10 mg as needed by mouth for allergies., Disp: , Rfl:  .  Multiple Vitamin (MULTIVITAMIN) capsule, Take 1 capsule by mouth every morning., Disp: , Rfl:    Allergies  Allergen Reactions  . Gluten Meal     ITCHING, RASH, STOMACH UPSET  . Latex     ITCHING     Review of Systems   Constitutional: Negative.   HENT: Negative.   Eyes: Negative.   Respiratory: Negative.   Cardiovascular: Negative.   Gastrointestinal: Negative.   Genitourinary: Negative.   Musculoskeletal: Negative.   Skin: Negative.   Hematological: Negative.   Psychiatric/Behavioral: Negative.      Today's Vitals   10/03/18 0851  BP: 118/76  Pulse: 89  Temp: 98.9 F (37.2 C)  TempSrc: Oral  SpO2: 96%  Weight: 172 lb 6.4 oz (78.2 kg)  Height: 5' 0.5" (1.537 m)  PainSc: 0-No pain   Body mass index is 33.12 kg/m.   Objective:  Physical Exam  Constitutional: She is oriented to person, place, and time. She appears well-developed and well-nourished.  HENT:  Head: Normocephalic and atraumatic.  Right Ear: External ear normal.  Left Ear: External ear normal.  Nose: Nose normal.  Mouth/Throat: Oropharynx is clear and moist.  Eyes: Pupils are equal, round, and reactive to light. Conjunctivae and EOM are normal.  Neck: Normal range of motion. Neck supple.  Cardiovascular: Normal rate, regular rhythm, normal heart sounds and intact distal pulses.  Pulmonary/Chest: Effort normal and breath sounds normal. Right breast exhibits no inverted nipple, no mass, no nipple discharge, no skin change and no tenderness. Left breast exhibits no inverted nipple, no mass, no nipple discharge, no skin change and no tenderness.  Abdominal: Soft. Bowel sounds are normal.  Genitourinary:  Genitourinary Comments: deferred  Musculoskeletal: Normal  range of motion.  Neurological: She is alert and oriented to person, place, and time.  Skin: Skin is warm and dry.  Psychiatric: She has a normal mood and affect.  Nursing note and vitals reviewed.       Assessment And Plan:     1. Encounter for general adult medical examination w/o abnormal findings  A full exam was performed.  Importance of monthly self breast exams was discussed with the patient. She is encouraged to follow a low-glycemic diet, low in gluten  and to exercise no less than 150 minutes per week. She is also encouraged to stay up to date with adult immunization schedule.   - POCT Urinalysis Dipstick (81002) - CBC no Diff - CMP14+EGFR - Hemoglobin A1c - Lipid Profile - TSH        Maximino Greenland, MD

## 2018-10-04 LAB — CBC
Hematocrit: 40.6 % (ref 34.0–46.6)
Hemoglobin: 13.3 g/dL (ref 11.1–15.9)
MCH: 29.2 pg (ref 26.6–33.0)
MCHC: 32.8 g/dL (ref 31.5–35.7)
MCV: 89 fL (ref 79–97)
PLATELETS: 301 10*3/uL (ref 150–450)
RBC: 4.56 x10E6/uL (ref 3.77–5.28)
RDW: 11.5 % — AB (ref 12.3–15.4)
WBC: 4 10*3/uL (ref 3.4–10.8)

## 2018-10-04 LAB — CMP14+EGFR
A/G RATIO: 1.7 (ref 1.2–2.2)
ALT: 15 IU/L (ref 0–32)
AST: 21 IU/L (ref 0–40)
Albumin: 4 g/dL (ref 3.5–5.5)
Alkaline Phosphatase: 62 IU/L (ref 39–117)
BUN/Creatinine Ratio: 14 (ref 9–23)
BUN: 13 mg/dL (ref 6–24)
Bilirubin Total: 0.3 mg/dL (ref 0.0–1.2)
CALCIUM: 9.4 mg/dL (ref 8.7–10.2)
CO2: 24 mmol/L (ref 20–29)
CREATININE: 0.9 mg/dL (ref 0.57–1.00)
Chloride: 102 mmol/L (ref 96–106)
GFR calc Af Amer: 81 mL/min/{1.73_m2} (ref >59)
GFR calc non Af Amer: 70 mL/min/{1.73_m2} (ref >59)
GLUCOSE: 85 mg/dL (ref 65–99)
Globulin, Total: 2.3 g/dL (ref 1.5–4.5)
POTASSIUM: 4.8 mmol/L (ref 3.5–5.2)
Sodium: 140 mmol/L (ref 134–144)
Total Protein: 6.3 g/dL (ref 6.0–8.5)

## 2018-10-04 LAB — LIPID PANEL
CHOLESTEROL TOTAL: 183 mg/dL (ref 100–199)
Chol/HDL Ratio: 2.6 ratio (ref 0.0–4.4)
HDL: 70 mg/dL (ref >39)
LDL Calculated: 97 mg/dL (ref 0–99)
Triglycerides: 80 mg/dL (ref 0–149)
VLDL CHOLESTEROL CAL: 16 mg/dL (ref 5–40)

## 2018-10-04 LAB — TSH: TSH: 1.83 u[IU]/mL (ref 0.450–4.500)

## 2018-10-04 LAB — HEMOGLOBIN A1C
ESTIMATED AVERAGE GLUCOSE: 105 mg/dL
Hgb A1c MFr Bld: 5.3 % (ref 4.8–5.6)

## 2018-10-09 NOTE — Progress Notes (Signed)
Here are your lab results:  Your blood count is normal.  Your liver and kidney function are normal. You are not prediabetic. Your cholesterol is great! Lastly, your thyroid function is normal.   Sincerely,    Zunairah Devers N. [facility], MD

## 2019-06-27 ENCOUNTER — Other Ambulatory Visit: Payer: Self-pay | Admitting: Obstetrics and Gynecology

## 2019-06-27 DIAGNOSIS — Z1231 Encounter for screening mammogram for malignant neoplasm of breast: Secondary | ICD-10-CM

## 2019-08-15 ENCOUNTER — Other Ambulatory Visit: Payer: Self-pay | Admitting: Internal Medicine

## 2019-08-15 DIAGNOSIS — Z1231 Encounter for screening mammogram for malignant neoplasm of breast: Secondary | ICD-10-CM

## 2019-09-03 ENCOUNTER — Other Ambulatory Visit: Payer: Self-pay | Admitting: Internal Medicine

## 2019-09-03 DIAGNOSIS — Z1231 Encounter for screening mammogram for malignant neoplasm of breast: Secondary | ICD-10-CM

## 2019-09-05 ENCOUNTER — Other Ambulatory Visit: Payer: Self-pay

## 2019-09-05 ENCOUNTER — Ambulatory Visit
Admission: RE | Admit: 2019-09-05 | Discharge: 2019-09-05 | Disposition: A | Discharge status: Home / Self Care | Payer: 59 | Source: Ambulatory Visit | Attending: Internal Medicine | Admitting: Internal Medicine

## 2019-09-05 DIAGNOSIS — Z1231 Encounter for screening mammogram for malignant neoplasm of breast: Secondary | ICD-10-CM | POA: Diagnosis not present

## 2019-09-11 ENCOUNTER — Telehealth: Payer: Self-pay | Admitting: Internal Medicine

## 2019-09-11 DIAGNOSIS — H2513 Age-related nuclear cataract, bilateral: Secondary | ICD-10-CM | POA: Diagnosis not present

## 2019-09-11 DIAGNOSIS — H5212 Myopia, left eye: Secondary | ICD-10-CM | POA: Diagnosis not present

## 2019-09-11 DIAGNOSIS — H04123 Dry eye syndrome of bilateral lacrimal glands: Secondary | ICD-10-CM | POA: Diagnosis not present

## 2019-09-11 DIAGNOSIS — H40013 Open angle with borderline findings, low risk, bilateral: Secondary | ICD-10-CM | POA: Diagnosis not present

## 2019-09-11 NOTE — Telephone Encounter (Signed)
LMOVM that appt has been resch to 11/2 due to RS not in the office on 10/27

## 2019-10-09 ENCOUNTER — Encounter: Payer: 59 | Admitting: Internal Medicine

## 2019-10-11 ENCOUNTER — Ambulatory Visit: Payer: 59 | Admitting: Internal Medicine

## 2019-10-11 ENCOUNTER — Encounter: Payer: Self-pay | Admitting: Internal Medicine

## 2019-10-11 ENCOUNTER — Other Ambulatory Visit: Payer: Self-pay

## 2019-10-11 VITALS — BP 128/72 | Temp 98.7°F | Ht 61.2 in | Wt 178.6 lb

## 2019-10-11 DIAGNOSIS — Z6833 Body mass index (BMI) 33.0-33.9, adult: Secondary | ICD-10-CM

## 2019-10-11 DIAGNOSIS — E6609 Other obesity due to excess calories: Secondary | ICD-10-CM

## 2019-10-11 DIAGNOSIS — Z0000 Encounter for general adult medical examination without abnormal findings: Secondary | ICD-10-CM

## 2019-10-11 DIAGNOSIS — N926 Irregular menstruation, unspecified: Secondary | ICD-10-CM

## 2019-10-11 LAB — POCT URINALYSIS DIPSTICK
Bilirubin, UA: NEGATIVE
Glucose, UA: NEGATIVE
Ketones, UA: NEGATIVE
Leukocytes, UA: NEGATIVE
Nitrite, UA: NEGATIVE
Protein, UA: NEGATIVE
Spec Grav, UA: <=1.005 — AB (ref 1.010–1.025)
Urobilinogen, UA: 0.2 E.U./dL
pH, UA: 6.5 (ref 5.0–8.0)

## 2019-10-11 NOTE — Patient Instructions (Signed)
Health Maintenance, Female Adopting a healthy lifestyle and getting preventive care are important in promoting health and wellness. Ask your health care provider about:  The right schedule for you to have regular tests and exams.  Things you can do on your own to prevent diseases and keep yourself healthy. What should I know about diet, weight, and exercise? Eat a healthy diet   Eat a diet that includes plenty of vegetables, fruits, low-fat dairy products, and lean protein.  Do not eat a lot of foods that are high in solid fats, added sugars, or sodium. Maintain a healthy weight Body mass index (BMI) is used to identify weight problems. It estimates body fat based on height and weight. Your health care provider can help determine your BMI and help you achieve or maintain a healthy weight. Get regular exercise Get regular exercise. This is one of the most important things you can do for your health. Most adults should:  Exercise for at least 150 minutes each week. The exercise should increase your heart rate and make you sweat (moderate-intensity exercise).  Do strengthening exercises at least twice a week. This is in addition to the moderate-intensity exercise.  Spend less time sitting. Even light physical activity can be beneficial. Watch cholesterol and blood lipids Have your blood tested for lipids and cholesterol at 60 years of age, then have this test every 5 years. Have your cholesterol levels checked more often if:  Your lipid or cholesterol levels are high.  You are older than 60 years of age.  You are at high risk for heart disease. What should I know about cancer screening? Depending on your health history and family history, you may need to have cancer screening at various ages. This may include screening for:  Breast cancer.  Cervical cancer.  Colorectal cancer.  Skin cancer.  Lung cancer. What should I know about heart disease, diabetes, and high blood  pressure? Blood pressure and heart disease  High blood pressure causes heart disease and increases the risk of stroke. This is more likely to develop in people who have high blood pressure readings, are of African descent, or are overweight.  Have your blood pressure checked: ? Every 3-5 years if you are 18-39 years of age. ? Every year if you are 40 years old or older. Diabetes Have regular diabetes screenings. This checks your fasting blood sugar level. Have the screening done:  Once every three years after age 40 if you are at a normal weight and have a low risk for diabetes.  More often and at a younger age if you are overweight or have a high risk for diabetes. What should I know about preventing infection? Hepatitis B If you have a higher risk for hepatitis B, you should be screened for this virus. Talk with your health care provider to find out if you are at risk for hepatitis B infection. Hepatitis C Testing is recommended for:  Everyone born from 1945 through 1965.  Anyone with known risk factors for hepatitis C. Sexually transmitted infections (STIs)  Get screened for STIs, including gonorrhea and chlamydia, if: ? You are sexually active and are younger than 60 years of age. ? You are older than 60 years of age and your health care provider tells you that you are at risk for this type of infection. ? Your sexual activity has changed since you were last screened, and you are at increased risk for chlamydia or gonorrhea. Ask your health care provider if   you are at risk.  Ask your health care provider about whether you are at high risk for HIV. Your health care provider may recommend a prescription medicine to help prevent HIV infection. If you choose to take medicine to prevent HIV, you should first get tested for HIV. You should then be tested every 3 months for as long as you are taking the medicine. Pregnancy  If you are about to stop having your period (premenopausal) and  you may become pregnant, seek counseling before you get pregnant.  Take 400 to 800 micrograms (mcg) of folic acid every day if you become pregnant.  Ask for birth control (contraception) if you want to prevent pregnancy. Osteoporosis and menopause Osteoporosis is a disease in which the bones lose minerals and strength with aging. This can result in bone fractures. If you are 65 years old or older, or if you are at risk for osteoporosis and fractures, ask your health care provider if you should:  Be screened for bone loss.  Take a calcium or vitamin D supplement to lower your risk of fractures.  Be given hormone replacement therapy (HRT) to treat symptoms of menopause. Follow these instructions at home: Lifestyle  Do not use any products that contain nicotine or tobacco, such as cigarettes, e-cigarettes, and chewing tobacco. If you need help quitting, ask your health care provider.  Do not use street drugs.  Do not share needles.  Ask your health care provider for help if you need support or information about quitting drugs. Alcohol use  Do not drink alcohol if: ? Your health care provider tells you not to drink. ? You are pregnant, may be pregnant, or are planning to become pregnant.  If you drink alcohol: ? Limit how much you use to 0-1 drink a day. ? Limit intake if you are breastfeeding.  Be aware of how much alcohol is in your drink. In the [place] one drink equals one 12 oz bottle of beer (355 mL), one 5 oz glass of wine (148 mL), or one 1 oz glass of hard liquor (44 mL). General instructions  Schedule regular health, dental, and eye exams.  Stay current with your vaccines.  Tell your health care provider if: ? You often feel depressed. ? You have ever been abused or do not feel safe at home. Summary  Adopting a healthy lifestyle and getting preventive care are important in promoting health and wellness.  Follow your health care provider's instructions about healthy  diet, exercising, and getting tested or screened for diseases.  Follow your health care provider's instructions on monitoring your cholesterol and blood pressure. This information is not intended to replace advice given to you by your health care provider. Make sure you discuss any questions you have with your health care provider. Document Released: 06/14/2011 Document Revised: 11/22/2018 Document Reviewed: 11/22/2018 [employer]vier Patient Education  2020 [employer]  

## 2019-10-11 NOTE — Progress Notes (Signed)
Subjective:     Patient ID: Kathleen Donovan , female    DOB: 1961-08-05 , 58 y.o.   MRN: [id number]   Chief Complaint  Patient presents with  . Annual Exam    HPI  She is here today for a full physical examination. She has no specific concerns or complaints at this time. She was previously seen by Dr. Leo Grosser for her GYN exams; however, she has since retired. She has appt next week with Earnstine Regal, PA.     Past Medical History:  Diagnosis Date  . Dysfunctional uterine bleeding   . Fibroids    uterine  . Seasonal allergies   . Varicose veins    BOTH LEGS     Family History  Problem Relation Age of Onset  . Ovarian cancer Mother   . Prostate cancer Father   . Hypertension Sister   . Asthma Sister   . Obesity Sister      Current Outpatient Medications:  .  levocetirizine (XYZAL) 5 MG tablet, 5 mg., Disp: , Rfl:  .  cholecalciferol (VITAMIN D) 1000 units tablet, Take 2,000 Units by mouth daily. , Disp: , Rfl:  .  Multiple Vitamin (MULTIVITAMIN) capsule, Take 1 capsule by mouth every morning., Disp: , Rfl:    Allergies  Allergen Reactions  . Gluten Meal     ITCHING, RASH, STOMACH UPSET  . Latex     ITCHING     The patient states she uses none for birth control. Last LMP was Patient's last menstrual period was 01/15/2012.. Negative for Dysmenorrhea '@MAMMOFINDINGS'$ @. Negative for: breast discharge, breast lump(s), breast pain and breast self exam. Associated symptoms include abnormal vaginal bleeding. Pertinent negatives include abnormal bleeding (hematology), anxiety, decreased libido, depression, difficulty falling sleep, dyspareunia, history of infertility, nocturia, sexual dysfunction, sleep disturbances, urinary incontinence, urinary urgency, vaginal discharge and vaginal itching. Diet regular.The patient states her exercise level is    . The patient's tobacco use is:  Social History   Tobacco Use  Smoking Status Never Smoker  Smokeless Tobacco Never Used  .  She has been exposed to passive smoke. The patient's alcohol use is:  Social History   Substance and Sexual Activity  Alcohol Use Yes   Comment: rarely  . Additional information: Last pap 08/17/2019, next one scheduled for 10/16/2019.   Review of Systems  Constitutional: Negative.   HENT: Negative.   Eyes: Negative.   Respiratory: Negative.   Cardiovascular: Negative.   Endocrine: Negative.   Genitourinary: Negative.        She reports having irregular menses. She was previously followed by Dr. Leo Grosser for her GYN exams.  She has been on vaginal estriol in the past. She did have some bleeding, so she stopped the medication. She was not given progesterone. She normally has estrogen and [place] levels drawn yearly by her GYN. She plans to see Earnstine Regal, PA next week. She reports cycle stopped in 2013, but maybe she was not officially postmenopausal b/c Dr. Leo Grosser was still following her [place] levels.   Musculoskeletal: Negative.   Skin: Negative.   Allergic/Immunologic: Negative.   Neurological: Negative.   Hematological: Negative.   Psychiatric/Behavioral: Negative.      Today's Vitals   10/11/19 0830  BP: 128/72  Temp: 98.7 F (37.1 C)  TempSrc: Oral  SpO2: (!) 72%  Weight: 178 lb 9.6 oz (81 kg)  Height: 5' 1.2" (1.554 m)   Body mass index is 33.53 kg/m.   Objective:  Physical Exam Vitals  signs and nursing note reviewed.  Constitutional:      Appearance: Normal appearance. She is obese.  HENT:     Head: Normocephalic and atraumatic.     Right Ear: Tympanic membrane, ear canal and external ear normal.     Left Ear: Tympanic membrane, ear canal and external ear normal.     Nose: Nose normal.     Mouth/Throat:     Mouth: Mucous membranes are moist.     Pharynx: Oropharynx is clear.  Eyes:     Extraocular Movements: Extraocular movements intact.     Conjunctiva/sclera: Conjunctivae normal.     Pupils: Pupils are equal, round, and reactive to light.  Neck:      Musculoskeletal: Normal range of motion and neck supple.  Cardiovascular:     Rate and Rhythm: Normal rate and regular rhythm.     Pulses: Normal pulses.     Heart sounds: Normal heart sounds.  Pulmonary:     Effort: Pulmonary effort is normal.     Breath sounds: Normal breath sounds.  Chest:     Breasts: Tanner Score is 5.        Right: Normal.        Left: Normal.  Abdominal:     General: Abdomen is flat. Bowel sounds are normal.     Palpations: Abdomen is soft.  Genitourinary:    Comments: deferred Musculoskeletal: Normal range of motion.  Skin:    General: Skin is warm and dry.  Neurological:     General: No focal deficit present.     Mental Status: She is alert and oriented to person, place, and time.  Psychiatric:        Mood and Affect: Mood normal.        Behavior: Behavior normal.         Assessment And Plan:     1. Encounter for general adult medical examination w/o abnormal findings  A full exam was performed.  Importance of monthly self breast exams was discussed with the patient. PATIENT HAS BEEN ADVISED TO GET 30-45 MINUTES REGULAR EXERCISE NO LESS THAN FOUR TO FIVE DAYS PER WEEK - BOTH WEIGHTBEARING EXERCISES AND AEROBIC ARE RECOMMENDED.  SHE WAS ADVISED TO FOLLOW A HEALTHY DIET WITH AT LEAST SIX FRUITS/VEGGIES PER DAY, DECREASE INTAKE OF RED MEAT, AND TO INCREASE FISH INTAKE TO TWO DAYS PER WEEK.  MEATS/FISH SHOULD NOT BE FRIED, BAKED OR BROILED IS PREFERABLE.  I SUGGEST WEARING SPF 50 SUNSCREEN ON EXPOSED PARTS AND ESPECIALLY WHEN IN THE DIRECT SUNLIGHT FOR AN EXTENDED PERIOD OF TIME.  PLEASE AVOID FAST FOOD RESTAURANTS AND INCREASE YOUR WATER INTAKE.  - POCT Urinalysis Dipstick (81002) - CMP14+EGFR - CBC - Lipid panel   2. Menses, irregular  I will check labs as listed below. We discussed importance of eating diet full of cruciferous veggies and void of processed foods. She is also encouraged to incorporate more exercise into her daily routine. I will  make further recommendations once her results are available. We also discussed estrogen dominance and how to address it. She will also discuss further with GYN next week.   - Estradiol - Progesterone - FSH   3. Class 1 obesity due to excess calories without serious comorbidity with body mass index (BMI) of 33.0 to 33.9 in adult  Importance of achieving optimal weight to decrease risk of cardiovascular disease and cancers was discussed with the patient in full detail. She is encouraged to start slowly - start with 10 minutes  twice daily at least three to four days per week and to gradually build to 30 minutes five days weekly. She was given tips to incorporate more activity into her daily routine - take stairs when possible, park farther away from her job, grocery stores, etc.    Maximino Greenland, MD    THE PATIENT IS ENCOURAGED TO PRACTICE SOCIAL DISTANCING DUE TO THE COVID-19 PANDEMIC.

## 2019-10-15 ENCOUNTER — Encounter: Payer: 59 | Admitting: Internal Medicine

## 2019-10-16 DIAGNOSIS — Z01419 Encounter for gynecological examination (general) (routine) without abnormal findings: Secondary | ICD-10-CM | POA: Diagnosis not present

## 2019-10-16 DIAGNOSIS — Z6833 Body mass index (BMI) 33.0-33.9, adult: Secondary | ICD-10-CM | POA: Diagnosis not present

## 2019-10-19 LAB — LIPID PANEL
Chol/HDL Ratio: 2.5 ratio (ref 0.0–4.4)
Cholesterol, Total: 211 mg/dL — ABNORMAL HIGH (ref 100–199)
HDL: 84 mg/dL (ref >39)
LDL Chol Calc (NIH): 114 mg/dL — ABNORMAL HIGH (ref 0–99)
Triglycerides: 76 mg/dL (ref 0–149)
VLDL Cholesterol Cal: 13 mg/dL (ref 5–40)

## 2019-10-19 LAB — CMP14+EGFR
ALT: 18 IU/L (ref 0–32)
AST: 25 IU/L (ref 0–40)
Albumin/Globulin Ratio: 1.6 (ref 1.2–2.2)
Albumin: 4.3 g/dL (ref 3.8–4.9)
Alkaline Phosphatase: 65 IU/L (ref 39–117)
BUN/Creatinine Ratio: 16 (ref 12–28)
BUN: 15 mg/dL (ref 8–27)
Bilirubin Total: 0.5 mg/dL (ref 0.0–1.2)
CO2: 21 mmol/L (ref 20–29)
Calcium: 9.1 mg/dL (ref 8.7–10.3)
Chloride: 101 mmol/L (ref 96–106)
Creatinine, Ser: 0.94 mg/dL (ref 0.57–1.00)
GFR calc Af Amer: 76 mL/min/{1.73_m2} (ref >59)
GFR calc non Af Amer: 66 mL/min/{1.73_m2} (ref >59)
Globulin, Total: 2.7 g/dL (ref 1.5–4.5)
Glucose: 80 mg/dL (ref 65–99)
Potassium: 4 mmol/L (ref 3.5–5.2)
Sodium: 139 mmol/L (ref 134–144)
Total Protein: 7 g/dL (ref 6.0–8.5)

## 2019-10-19 LAB — FOLLICLE STIMULATING HORMONE: FSH: 39 m[IU]/mL

## 2019-10-19 LAB — ESTRADIOL, FREE
Estradiol, Serum, MS: 92 pg/mL
Free Estradiol, Percent: 1 %
Free Estradiol, Serum: 0.92 pg/mL

## 2019-10-19 LAB — CBC
Hematocrit: 42.3 % (ref 34.0–46.6)
Hemoglobin: 13.9 g/dL (ref 11.1–15.9)
MCH: 29.3 pg (ref 26.6–33.0)
MCHC: 32.9 g/dL (ref 31.5–35.7)
MCV: 89 fL (ref 79–97)
Platelets: 292 10*3/uL (ref 150–450)
RBC: 4.75 x10E6/uL (ref 3.77–5.28)
RDW: 11.9 % (ref 11.7–15.4)
WBC: 4 10*3/uL (ref 3.4–10.8)

## 2019-10-19 LAB — PROGESTERONE: Progesterone: <0.1 ng/mL

## 2020-02-20 DIAGNOSIS — H40013 Open angle with borderline findings, low risk, bilateral: Secondary | ICD-10-CM | POA: Diagnosis not present

## 2020-09-10 DIAGNOSIS — H2513 Age-related nuclear cataract, bilateral: Secondary | ICD-10-CM | POA: Diagnosis not present

## 2020-09-10 DIAGNOSIS — H04123 Dry eye syndrome of bilateral lacrimal glands: Secondary | ICD-10-CM | POA: Diagnosis not present

## 2020-09-10 DIAGNOSIS — H40013 Open angle with borderline findings, low risk, bilateral: Secondary | ICD-10-CM | POA: Diagnosis not present

## 2020-09-12 ENCOUNTER — Other Ambulatory Visit: Payer: Self-pay | Admitting: Internal Medicine

## 2020-09-12 DIAGNOSIS — Z1231 Encounter for screening mammogram for malignant neoplasm of breast: Secondary | ICD-10-CM

## 2020-09-20 ENCOUNTER — Ambulatory Visit (INDEPENDENT_AMBULATORY_CARE_PROVIDER_SITE_OTHER): Payer: 59

## 2020-09-20 DIAGNOSIS — Z23 Encounter for immunization: Secondary | ICD-10-CM | POA: Diagnosis not present

## 2020-09-30 ENCOUNTER — Ambulatory Visit: Payer: 59

## 2020-10-21 ENCOUNTER — Other Ambulatory Visit: Payer: Self-pay

## 2020-10-21 ENCOUNTER — Ambulatory Visit: Payer: 59 | Admitting: Internal Medicine

## 2020-10-21 ENCOUNTER — Encounter: Payer: Self-pay | Admitting: Internal Medicine

## 2020-10-21 VITALS — BP 112/74 | HR 86 | Temp 98.3°F | Ht 61.0 in | Wt 173.6 lb

## 2020-10-21 DIAGNOSIS — E6609 Other obesity due to excess calories: Secondary | ICD-10-CM

## 2020-10-21 DIAGNOSIS — N926 Irregular menstruation, unspecified: Secondary | ICD-10-CM

## 2020-10-21 DIAGNOSIS — Z6832 Body mass index (BMI) 32.0-32.9, adult: Secondary | ICD-10-CM | POA: Diagnosis not present

## 2020-10-21 DIAGNOSIS — Z0000 Encounter for general adult medical examination without abnormal findings: Secondary | ICD-10-CM

## 2020-10-21 DIAGNOSIS — E559 Vitamin D deficiency, unspecified: Secondary | ICD-10-CM | POA: Diagnosis not present

## 2020-10-21 NOTE — Patient Instructions (Signed)
Health Maintenance, Female Adopting a healthy lifestyle and getting preventive care are important in promoting health and wellness. Ask your health care provider about:  The right schedule for you to have regular tests and exams.  Things you can do on your own to prevent diseases and keep yourself healthy. What should I know about diet, weight, and exercise? Eat a healthy diet   Eat a diet that includes plenty of vegetables, fruits, low-fat dairy products, and lean protein.  Do not eat a lot of foods that are high in solid fats, added sugars, or sodium. Maintain a healthy weight Body mass index (BMI) is used to identify weight problems. It estimates body fat based on height and weight. Your health care provider can help determine your BMI and help you achieve or maintain a healthy weight. Get regular exercise Get regular exercise. This is one of the most important things you can do for your health. Most adults should:  Exercise for at least 150 minutes each week. The exercise should increase your heart rate and make you sweat (moderate-intensity exercise).  Do strengthening exercises at least twice a week. This is in addition to the moderate-intensity exercise.  Spend less time sitting. Even light physical activity can be beneficial. Watch cholesterol and blood lipids Have your blood tested for lipids and cholesterol at 61 years of age, then have this test every 5 years. Have your cholesterol levels checked more often if:  Your lipid or cholesterol levels are high.  You are older than 61 years of age.  You are at high risk for heart disease. What should I know about cancer screening? Depending on your health history and family history, you may need to have cancer screening at various ages. This may include screening for:  Breast cancer.  Cervical cancer.  Colorectal cancer.  Skin cancer.  Lung cancer. What should I know about heart disease, diabetes, and high blood  pressure? Blood pressure and heart disease  High blood pressure causes heart disease and increases the risk of stroke. This is more likely to develop in people who have high blood pressure readings, are of African descent, or are overweight.  Have your blood pressure checked: ? Every 3-5 years if you are 18-39 years of age. ? Every year if you are 40 years old or older. Diabetes Have regular diabetes screenings. This checks your fasting blood sugar level. Have the screening done:  Once every three years after age 40 if you are at a normal weight and have a low risk for diabetes.  More often and at a younger age if you are overweight or have a high risk for diabetes. What should I know about preventing infection? Hepatitis B If you have a higher risk for hepatitis B, you should be screened for this virus. Talk with your health care provider to find out if you are at risk for hepatitis B infection. Hepatitis C Testing is recommended for:  Everyone born from 1945 through 1965.  Anyone with known risk factors for hepatitis C. Sexually transmitted infections (STIs)  Get screened for STIs, including gonorrhea and chlamydia, if: ? You are sexually active and are younger than 61 years of age. ? You are older than 61 years of age and your health care provider tells you that you are at risk for this type of infection. ? Your sexual activity has changed since you were last screened, and you are at increased risk for chlamydia or gonorrhea. Ask your health care provider if   you are at risk.  Ask your health care provider about whether you are at high risk for HIV. Your health care provider may recommend a prescription medicine to help prevent HIV infection. If you choose to take medicine to prevent HIV, you should first get tested for HIV. You should then be tested every 3 months for as long as you are taking the medicine. Pregnancy  If you are about to stop having your period (premenopausal) and  you may become pregnant, seek counseling before you get pregnant.  Take 400 to 800 micrograms (mcg) of folic acid every day if you become pregnant.  Ask for birth control (contraception) if you want to prevent pregnancy. Osteoporosis and menopause Osteoporosis is a disease in which the bones lose minerals and strength with aging. This can result in bone fractures. If you are 65 years old or older, or if you are at risk for osteoporosis and fractures, ask your health care provider if you should:  Be screened for bone loss.  Take a calcium or vitamin D supplement to lower your risk of fractures.  Be given hormone replacement therapy (HRT) to treat symptoms of menopause. Follow these instructions at home: Lifestyle  Do not use any products that contain nicotine or tobacco, such as cigarettes, e-cigarettes, and chewing tobacco. If you need help quitting, ask your health care provider.  Do not use street drugs.  Do not share needles.  Ask your health care provider for help if you need support or information about quitting drugs. Alcohol use  Do not drink alcohol if: ? Your health care provider tells you not to drink. ? You are pregnant, may be pregnant, or are planning to become pregnant.  If you drink alcohol: ? Limit how much you use to 0-1 drink a day. ? Limit intake if you are breastfeeding.  Be aware of how much alcohol is in your drink. In the [place] one drink equals one 12 oz bottle of beer (355 mL), one 5 oz glass of wine (148 mL), or one 1 oz glass of hard liquor (44 mL). General instructions  Schedule regular health, dental, and eye exams.  Stay current with your vaccines.  Tell your health care provider if: ? You often feel depressed. ? You have ever been abused or do not feel safe at home. Summary  Adopting a healthy lifestyle and getting preventive care are important in promoting health and wellness.  Follow your health care provider's instructions about healthy  diet, exercising, and getting tested or screened for diseases.  Follow your health care provider's instructions on monitoring your cholesterol and blood pressure. This information is not intended to replace advice given to you by your health care provider. Make sure you discuss any questions you have with your health care provider. Document Revised: 11/22/2018 Document Reviewed: 11/22/2018 Elsevier Patient Education  2020 [employer] Inc[employer]  

## 2020-10-21 NOTE — Progress Notes (Signed)
I,Katawbba Wiggins,acting as a Neurosurgeon for Gwynneth Aliment, MD.,have documented all relevant documentation on the behalf of Gwynneth Aliment, MD,as directed by  Gwynneth Aliment, MD while in the presence of Gwynneth Aliment, MD.  This visit occurred during the SARS-CoV-2 public health emergency.  Safety protocols were in place, including screening questions prior to the visit, additional usage of staff PPE, and extensive cleaning of exam room while observing appropriate contact time as indicated for disinfecting solutions.  Subjective:     Patient ID: Kathleen Donovan , female    DOB: [birth year] , 59 y.o.   MRN: [id number]   Chief Complaint  Patient presents with  . Annual Exam    HPI  She is here today for a full physical examination. She is followed by Henreitta Leber, PA for her pelvic exams. She is still having monthly menses. Has had previous extensive GYN workup, all negative.     Past Medical History:  Diagnosis Date  . Dysfunctional uterine bleeding   . Fibroids    uterine  . Seasonal allergies   . Varicose veins    BOTH LEGS     Family History  Problem Relation Age of Onset  . Ovarian cancer Mother   . Prostate cancer Father   . Hypertension Sister   . Asthma Sister   . Obesity Sister      Current Outpatient Medications:  .  cholecalciferol (VITAMIN D) 1000 units tablet, Take 2,000 Units by mouth daily. , Disp: , Rfl:  .  levocetirizine (XYZAL) 5 MG tablet, 5 mg., Disp: , Rfl:  .  Multiple Vitamins-Minerals (CENTRUM SILVER 50+WOMEN PO), , Disp: , Rfl:  .  Probiotic Product (PROBIOTIC PO), Take by mouth. Ultra [place] 1 per day, Disp: , Rfl:    Allergies  Allergen Reactions  . Gluten Meal     ITCHING, RASH, STOMACH UPSET  . Latex     ITCHING      The patient states she uses none for birth control. Last LMP was No LMP recorded (lmp unknown). Patient is perimenopausal.. Negative for Dysmenorrhea. Negative for: breast discharge, breast lump(s), breast pain and  breast self exam. Associated symptoms include abnormal vaginal bleeding. Pertinent negatives include abnormal bleeding (hematology), anxiety, decreased libido, depression, difficulty falling sleep, dyspareunia, history of infertility, nocturia, sexual dysfunction, sleep disturbances, urinary incontinence, urinary urgency, vaginal discharge and vaginal itching. Diet regular.She follows gluten free meal plan.     . The patient's tobacco use is:  Social History   Tobacco Use  Smoking Status Never Smoker  Smokeless Tobacco Never Used  . She has been exposed to passive smoke. The patient's alcohol use is:  Social History   Substance and Sexual Activity  Alcohol Use Yes   Comment: rarely    Review of Systems  Constitutional: Negative.   HENT: Negative.   Eyes: Negative.   Respiratory: Negative.   Cardiovascular: Negative.   Gastrointestinal: Negative.   Endocrine: Negative.   Genitourinary: Negative.   Musculoskeletal: Negative.   Skin: Negative.   Allergic/Immunologic: Negative.   Neurological: Negative.   Hematological: Negative.   Psychiatric/Behavioral: Negative.      Today's Vitals   10/21/20 0840  BP: 112/74  Pulse: 86  Temp: 98.3 F (36.8 C)  TempSrc: Oral  Weight: 173 lb 9.6 oz (78.7 kg)  Height: 5\' 1"  (1.549 m)   Body mass index is 32.8 kg/m.  Wt Readings from Last 3 Encounters:  10/21/20 173 lb 9.6 oz (78.7 kg)  10/11/19  178 lb 9.6 oz (81 kg)  10/03/18 172 lb 6.4 oz (78.2 kg)   Objective:  Physical Exam Constitutional:      General: She is not in acute distress.    Appearance: Normal appearance. She is well-developed.  HENT:     Head: Normocephalic and atraumatic.     Right Ear: Hearing, tympanic membrane, ear canal and external ear normal. There is no impacted cerumen.     Left Ear: Hearing, tympanic membrane, ear canal and external ear normal. There is no impacted cerumen.     Nose:     Comments: Deferred, masked    Mouth/Throat:     Comments:  Deferred, masked  Eyes:     General: Lids are normal.     Extraocular Movements: Extraocular movements intact.     Conjunctiva/sclera: Conjunctivae normal.     Pupils: Pupils are equal, round, and reactive to light.     Funduscopic exam:    Right eye: No papilledema.        Left eye: No papilledema.  Neck:     Thyroid: No thyroid mass.     Vascular: No carotid bruit.  Cardiovascular:     Rate and Rhythm: Normal rate and regular rhythm.     Pulses: Normal pulses.     Heart sounds: Normal heart sounds. No murmur heard.   Pulmonary:     Effort: Pulmonary effort is normal.     Breath sounds: Normal breath sounds.  Chest:     Breasts: Tanner Score is 5.        Right: Normal.        Left: Normal.  Abdominal:     General: Bowel sounds are normal. There is no distension.     Palpations: Abdomen is soft.     Tenderness: There is no abdominal tenderness.  Genitourinary:    Comments: Deferred  Musculoskeletal:        General: No swelling. Normal range of motion.     Cervical back: Full passive range of motion without pain, normal range of motion and neck supple.     Right lower leg: No edema.     Left lower leg: No edema.  Skin:    General: Skin is warm and dry.     Capillary Refill: Capillary refill takes less than 2 seconds.  Neurological:     General: No focal deficit present.     Mental Status: She is alert and oriented to person, place, and time.     Cranial Nerves: No cranial nerve deficit.     Sensory: No sensory deficit.  Psychiatric:        Mood and Affect: Mood normal.        Behavior: Behavior normal.        Thought Content: Thought content normal.        Judgment: Judgment normal.         Assessment And Plan:     1. Encounter for general adult medical examination w/o abnormal findings Comments: A full exam was performed. Importance of monthly self breast exams was discussed with the patient. PATIENT IS ADVISED TO GET 30-45 MINUTES REGULAR EXERCISE NO LESS THAN  FOUR TO FIVE DAYS PER WEEK - BOTH WEIGHTBEARING EXERCISES AND AEROBIC ARE RECOMMENDED.  PATIENT IS ADVISED TO FOLLOW A HEALTHY DIET WITH AT LEAST SIX FRUITS/VEGGIES PER DAY, DECREASE INTAKE OF RED MEAT, AND TO INCREASE FISH INTAKE TO TWO DAYS PER WEEK.  MEATS/FISH SHOULD NOT BE FRIED, BAKED OR BROILED IS PREFERABLE.  I SUGGEST WEARING SPF 50 SUNSCREEN ON EXPOSED PARTS AND ESPECIALLY WHEN IN THE DIRECT SUNLIGHT FOR AN EXTENDED PERIOD OF TIME.  PLEASE AVOID FAST FOOD RESTAURANTS AND INCREASE YOUR WATER INTAKE.  - Hemoglobin A1c - CBC - CMP14+EGFR - Lipid panel  2. Menses, irregular Comments: She will continue under the care of GYN, has appt tomorrow. She may benefit from progesterone supplementation in the future. - TSH - Progesterone - FSH - Estradiol  3. Vitamin D deficiency Comments: I will check a vitamin D level and supplement as needed.  - Vitamin D (25 hydroxy)  4. Class 1 obesity due to excess calories without serious comorbidity with body mass index (BMI) of 32.0 to 32.9 in adult Comments: She is encouraged to exercise regularly, aiming for at least 150 minutes per week.     Patient was given opportunity to ask questions. Patient verbalized understanding of the plan and was able to repeat key elements of the plan. All questions were answered to their satisfaction.   Maximino Greenland, MD   I, Maximino Greenland, MD, have reviewed all documentation for this visit. The documentation on 11/09/20 for the exam, diagnosis, procedures, and orders are all accurate and complete.  THE PATIENT IS ENCOURAGED TO PRACTICE SOCIAL DISTANCING DUE TO THE COVID-19 PANDEMIC.

## 2020-10-22 ENCOUNTER — Encounter: Payer: Self-pay | Admitting: Internal Medicine

## 2020-10-22 DIAGNOSIS — Z6833 Body mass index (BMI) 33.0-33.9, adult: Secondary | ICD-10-CM | POA: Diagnosis not present

## 2020-10-22 DIAGNOSIS — Z01419 Encounter for gynecological examination (general) (routine) without abnormal findings: Secondary | ICD-10-CM | POA: Diagnosis not present

## 2020-10-22 LAB — CBC
Hematocrit: 42.4 % (ref 34.0–46.6)
Hemoglobin: 13.3 g/dL (ref 11.1–15.9)
MCH: 28.4 pg (ref 26.6–33.0)
MCHC: 31.4 g/dL — ABNORMAL LOW (ref 31.5–35.7)
MCV: 90 fL (ref 79–97)
Platelets: 258 10*3/uL (ref 150–450)
RBC: 4.69 x10E6/uL (ref 3.77–5.28)
RDW: 12 % (ref 11.7–15.4)
WBC: 4.3 10*3/uL (ref 3.4–10.8)

## 2020-10-22 LAB — CMP14+EGFR
ALT: 15 IU/L (ref 0–32)
AST: 19 IU/L (ref 0–40)
Albumin/Globulin Ratio: 1.3 (ref 1.2–2.2)
Albumin: 4 g/dL (ref 3.8–4.8)
Alkaline Phosphatase: 59 IU/L (ref 44–121)
BUN/Creatinine Ratio: 12 (ref 12–28)
BUN: 11 mg/dL (ref 8–27)
Bilirubin Total: 0.4 mg/dL (ref 0.0–1.2)
CO2: 23 mmol/L (ref 20–29)
Calcium: 9.1 mg/dL (ref 8.7–10.3)
Chloride: 101 mmol/L (ref 96–106)
Creatinine, Ser: 0.91 mg/dL (ref 0.57–1.00)
GFR calc Af Amer: 79 mL/min/{1.73_m2} (ref >59)
GFR calc non Af Amer: 68 mL/min/{1.73_m2} (ref >59)
Globulin, Total: 3 g/dL (ref 1.5–4.5)
Glucose: 83 mg/dL (ref 65–99)
Potassium: 4.2 mmol/L (ref 3.5–5.2)
Sodium: 138 mmol/L (ref 134–144)
Total Protein: 7 g/dL (ref 6.0–8.5)

## 2020-10-22 LAB — ESTRADIOL: Estradiol: 70.8 pg/mL

## 2020-10-22 LAB — LIPID PANEL
Chol/HDL Ratio: 2.8 ratio (ref 0.0–4.4)
Cholesterol, Total: 218 mg/dL — ABNORMAL HIGH (ref 100–199)
HDL: 79 mg/dL (ref >39)
LDL Chol Calc (NIH): 128 mg/dL — ABNORMAL HIGH (ref 0–99)
Triglycerides: 61 mg/dL (ref 0–149)
VLDL Cholesterol Cal: 11 mg/dL (ref 5–40)

## 2020-10-22 LAB — TSH: TSH: 1.17 u[IU]/mL (ref 0.450–4.500)

## 2020-10-22 LAB — VITAMIN D 25 HYDROXY (VIT D DEFICIENCY, FRACTURES): Vit D, 25-Hydroxy: 57.9 ng/mL (ref 30.0–100.0)

## 2020-10-22 LAB — HEMOGLOBIN A1C
Est. average glucose Bld gHb Est-mCnc: 111 mg/dL
Hgb A1c MFr Bld: 5.5 % (ref 4.8–5.6)

## 2020-10-22 LAB — PROGESTERONE: Progesterone: 0.2 ng/mL

## 2020-10-22 LAB — FOLLICLE STIMULATING HORMONE: FSH: 42.9 m[IU]/mL

## 2020-11-04 ENCOUNTER — Ambulatory Visit
Admission: RE | Admit: 2020-11-04 | Discharge: 2020-11-04 | Disposition: A | Discharge status: Home / Self Care | Payer: 59 | Source: Ambulatory Visit | Attending: Internal Medicine | Admitting: Internal Medicine

## 2020-11-04 ENCOUNTER — Other Ambulatory Visit: Payer: Self-pay

## 2020-11-04 DIAGNOSIS — Z1231 Encounter for screening mammogram for malignant neoplasm of breast: Secondary | ICD-10-CM

## 2021-04-03 IMAGING — MG DIGITAL SCREENING BILAT W/ TOMO W/ CAD
8 series · 8 of 24 positions shown · non-contrast
Comparison: Previous exam(s).

CLINICAL DATA: Screening.

EXAM:
DIGITAL SCREENING BILATERAL MAMMOGRAM WITH TOMO AND CAD

[L MLO synth-2D]
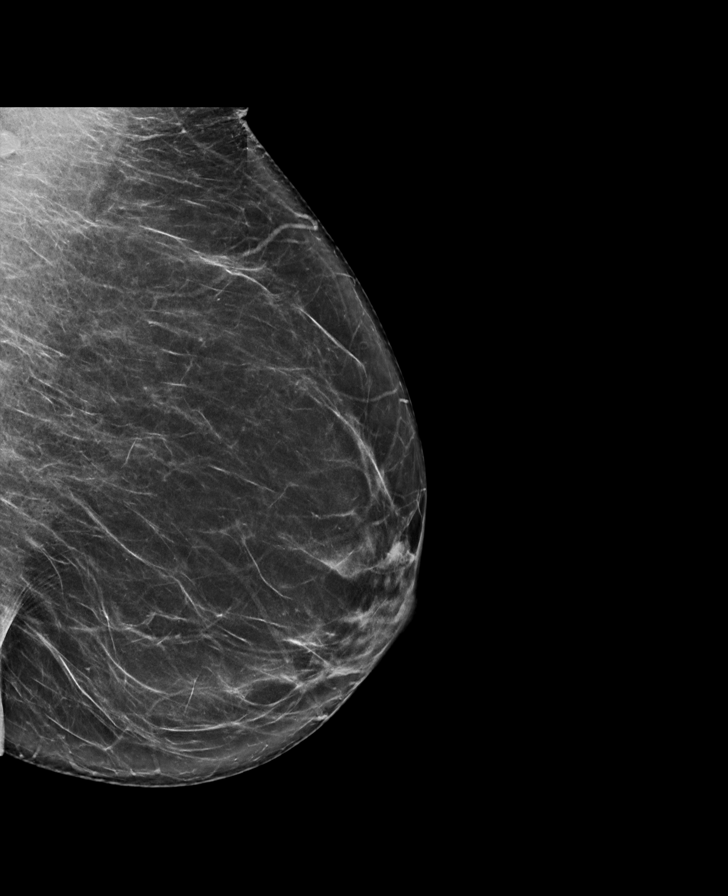

[R MLO synth-2D]
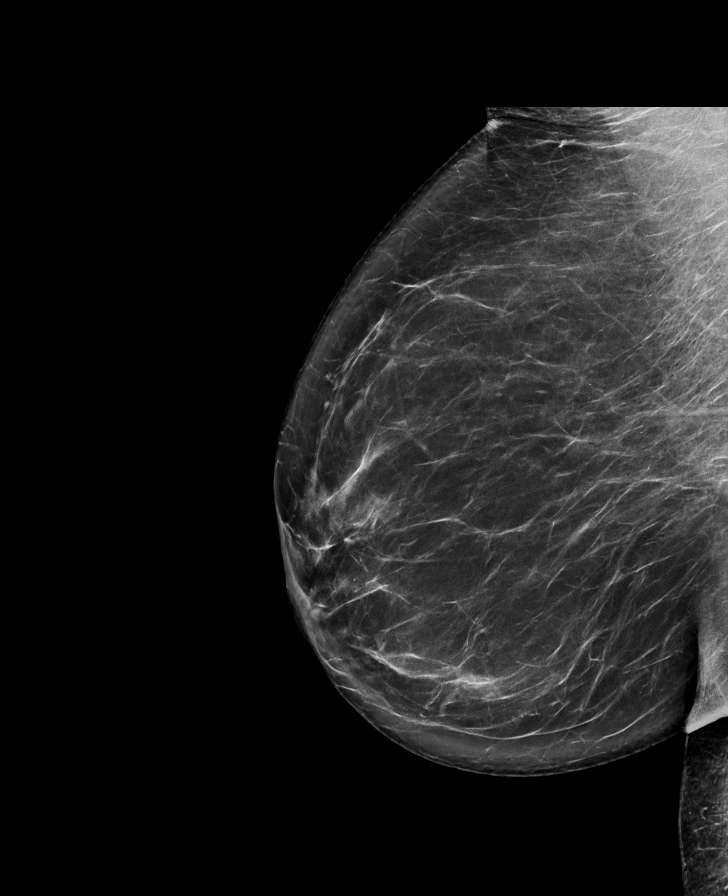

[R CC synth-2D]
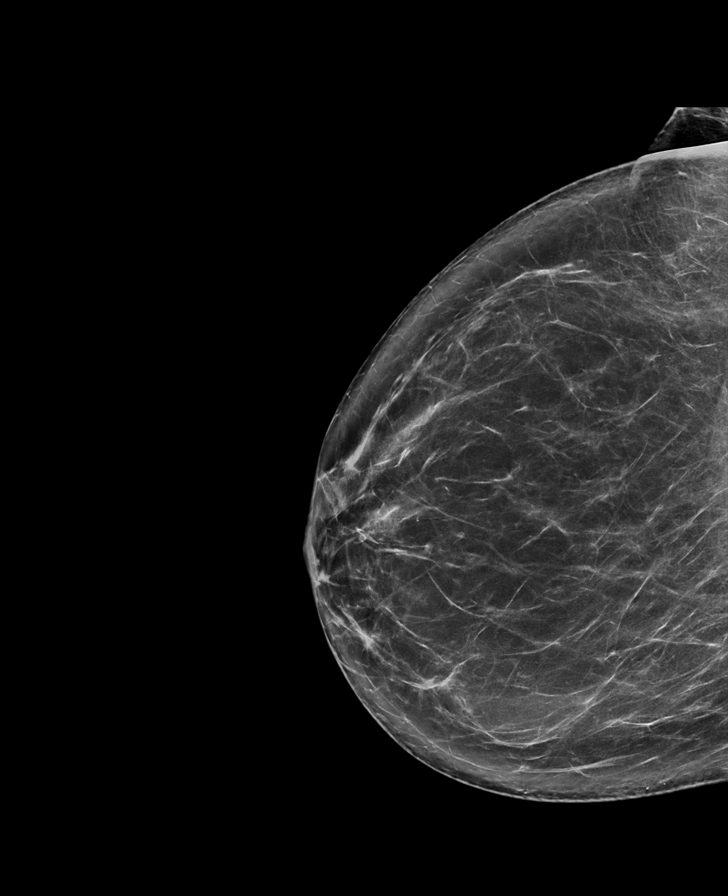

[L CC synth-2D]
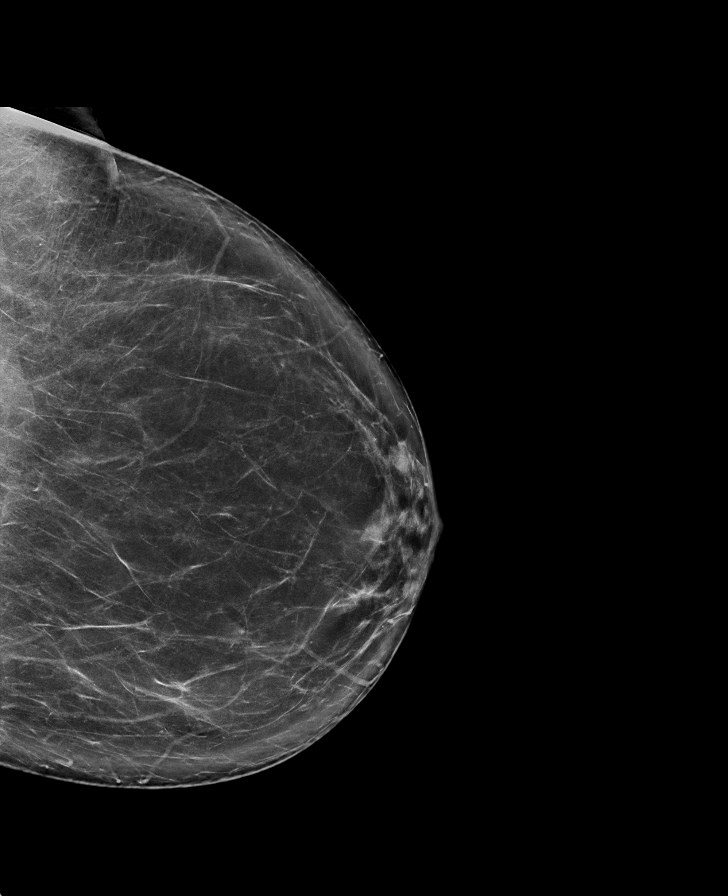

[R MLO tomo · tomo slice 44/87.0]
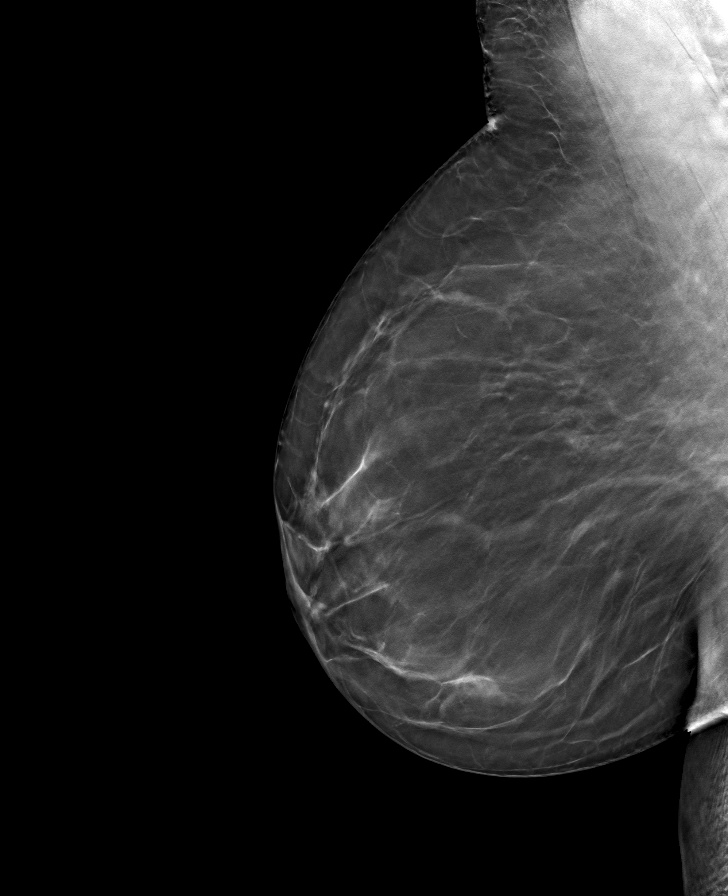

[L CC tomo · tomo slice 44/87.0]
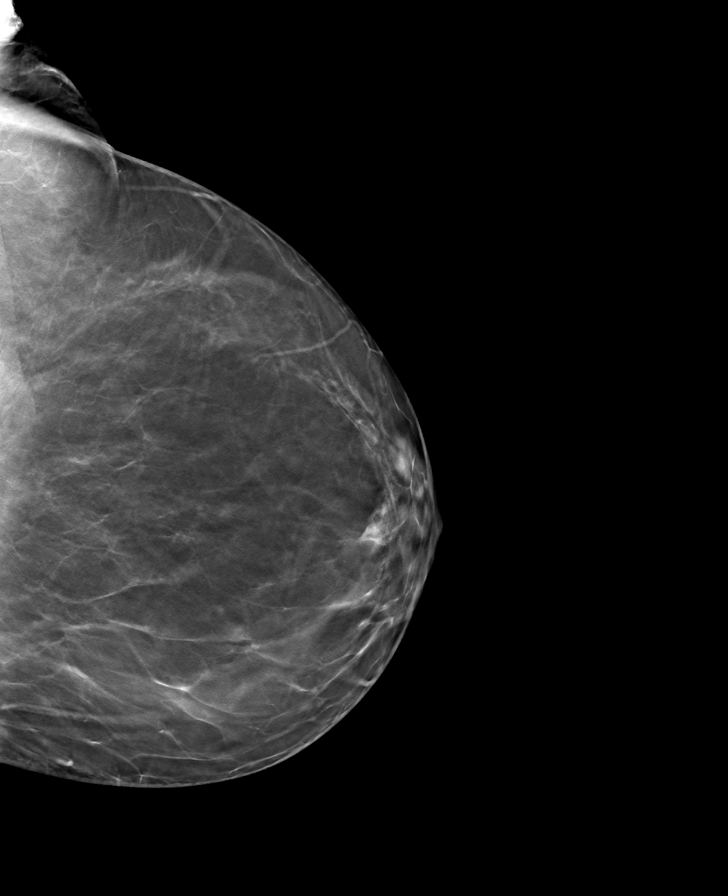

[L MLO tomo · tomo slice 41/82.0]
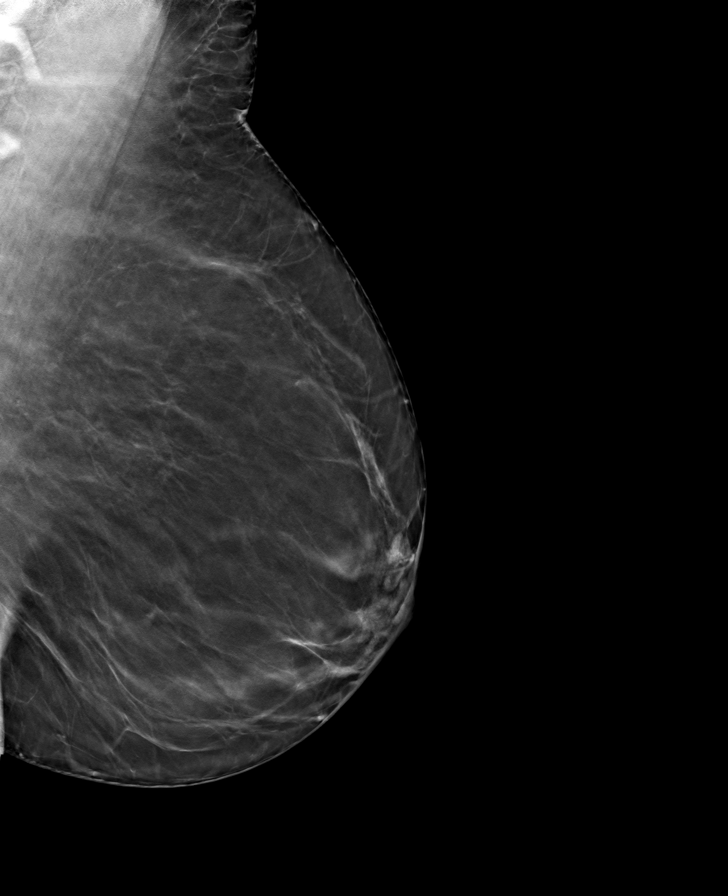

[R CC tomo · tomo slice 42/83.0]
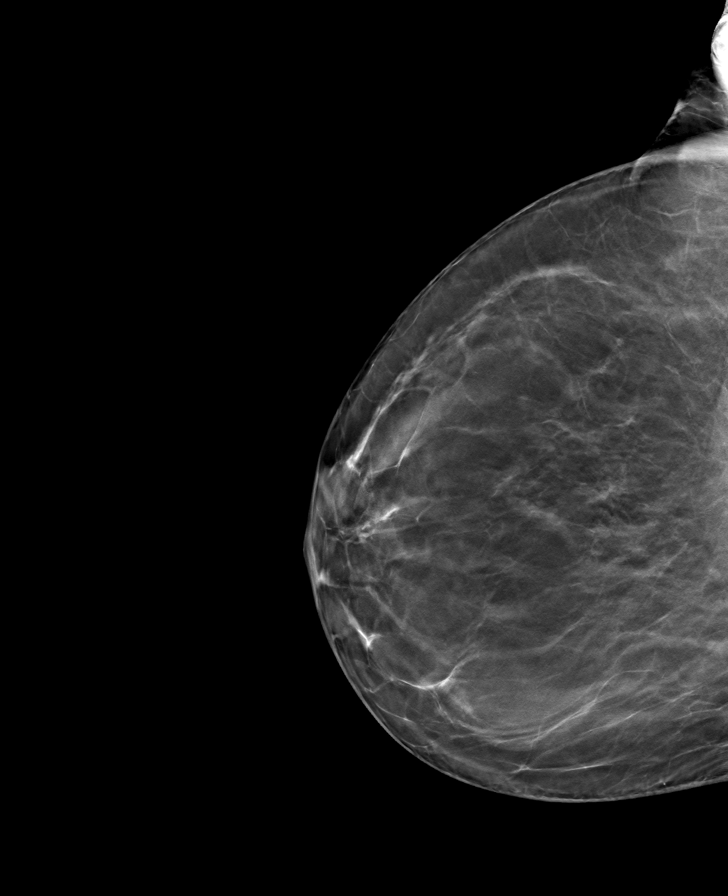

[8 of 24 positions shown; findings below may reference images not displayed]

ACR Breast Density Category b: There are scattered areas of
fibroglandular density.
FINDINGS: There are no findings suspicious for malignancy. Images were
processed with CAD.
IMPRESSION: No mammographic evidence of malignancy. A result letter of this
screening mammogram will be mailed directly to the patient.

RECOMMENDATION:
Screening mammogram in one year. (Code:[id number])

BI-RADS CATEGORY  1: Negative.

## 2021-05-23 ENCOUNTER — Ambulatory Visit (INDEPENDENT_AMBULATORY_CARE_PROVIDER_SITE_OTHER): Payer: 59

## 2021-05-23 DIAGNOSIS — Z23 Encounter for immunization: Secondary | ICD-10-CM

## 2021-09-16 ENCOUNTER — Other Ambulatory Visit: Payer: Self-pay | Admitting: Internal Medicine

## 2021-09-16 DIAGNOSIS — H04123 Dry eye syndrome of bilateral lacrimal glands: Secondary | ICD-10-CM | POA: Diagnosis not present

## 2021-09-16 DIAGNOSIS — H2513 Age-related nuclear cataract, bilateral: Secondary | ICD-10-CM | POA: Diagnosis not present

## 2021-09-16 DIAGNOSIS — H40013 Open angle with borderline findings, low risk, bilateral: Secondary | ICD-10-CM | POA: Diagnosis not present

## 2021-09-16 DIAGNOSIS — Z1231 Encounter for screening mammogram for malignant neoplasm of breast: Secondary | ICD-10-CM

## 2021-10-27 ENCOUNTER — Encounter: Payer: 59 | Admitting: Internal Medicine

## 2021-10-28 DIAGNOSIS — Z6833 Body mass index (BMI) 33.0-33.9, adult: Secondary | ICD-10-CM | POA: Diagnosis not present

## 2021-10-28 DIAGNOSIS — Z01419 Encounter for gynecological examination (general) (routine) without abnormal findings: Secondary | ICD-10-CM | POA: Diagnosis not present

## 2021-10-28 DIAGNOSIS — Z124 Encounter for screening for malignant neoplasm of cervix: Secondary | ICD-10-CM | POA: Diagnosis not present

## 2021-10-28 LAB — HM PAP SMEAR: HM Pap smear: ABNORMAL

## 2021-10-29 ENCOUNTER — Other Ambulatory Visit: Payer: Self-pay

## 2021-10-29 ENCOUNTER — Encounter: Payer: Self-pay | Admitting: Internal Medicine

## 2021-10-29 ENCOUNTER — Ambulatory Visit (INDEPENDENT_AMBULATORY_CARE_PROVIDER_SITE_OTHER): Payer: 59 | Admitting: Internal Medicine

## 2021-10-29 VITALS — BP 118/86 | HR 62 | Temp 98.7°F | Wt 177.4 lb

## 2021-10-29 DIAGNOSIS — Z23 Encounter for immunization: Secondary | ICD-10-CM | POA: Diagnosis not present

## 2021-10-29 DIAGNOSIS — Z0000 Encounter for general adult medical examination without abnormal findings: Secondary | ICD-10-CM

## 2021-10-29 DIAGNOSIS — M791 Myalgia, unspecified site: Secondary | ICD-10-CM | POA: Diagnosis not present

## 2021-10-29 DIAGNOSIS — E559 Vitamin D deficiency, unspecified: Secondary | ICD-10-CM | POA: Diagnosis not present

## 2021-10-29 LAB — LIPID PANEL
Chol/HDL Ratio: 2.5 ratio (ref 0.0–4.4)
Cholesterol, Total: 199 mg/dL (ref 100–199)
HDL: 81 mg/dL (ref >39)
LDL Chol Calc (NIH): 105 mg/dL — ABNORMAL HIGH (ref 0–99)
Triglycerides: 73 mg/dL (ref 0–149)
VLDL Cholesterol Cal: 13 mg/dL (ref 5–40)

## 2021-10-29 MED ORDER — TETANUS-DIPHTH-ACELL PERTUSSIS 5-2.5-18.5 LF-MCG/0.5 IM SUSY
0.5000 mL | PREFILLED_SYRINGE | Freq: Once | INTRAMUSCULAR | Status: AC
  Administered 2021-10-29: 09:00:00 0.5 mL via INTRAMUSCULAR

## 2021-10-29 NOTE — Progress Notes (Signed)
[name],acting as a Education administrator for Maximino Greenland, MD.,have documented all relevant documentation on the behalf of Maximino Greenland, MD,as directed by  Maximino Greenland, MD while in the presence of Maximino Greenland, MD.  This visit occurred during the SARS-CoV-2 public health emergency.  Safety protocols were in place, including screening questions prior to the visit, additional usage of staff PPE, and extensive cleaning of exam room while observing appropriate contact time as indicated for disinfecting solutions.  Subjective:     Patient ID: Kathleen Donovan , female    DOB: 10/01/1961 , 60 y.o.   MRN: [id number]   Chief Complaint  Patient presents with   Annual Exam    HPI  She is here today for a full physical examination. She is followed by Earnstine Regal, PA for her pelvic exams. She is up to date with cervical, breast and colon cancer screenings.     Past Medical History:  Diagnosis Date   Dysfunctional uterine bleeding    Fibroids    uterine   Seasonal allergies    Varicose veins    BOTH LEGS     Family History  Problem Relation Age of Onset   Ovarian cancer Mother    Prostate cancer Father    Hypertension Sister    Asthma Sister    Obesity Sister      Current Outpatient Medications:    cholecalciferol (VITAMIN D) 1000 units tablet, Take 2,000 Units by mouth daily. , Disp: , Rfl:    levocetirizine (XYZAL) 5 MG tablet, 5 mg., Disp: , Rfl:    Multiple Vitamins-Minerals (CENTRUM SILVER 50+WOMEN PO), , Disp: , Rfl:    Probiotic Product (PROBIOTIC PO), Take by mouth. Ultra Flora 1 per day, Disp: , Rfl:    Allergies  Allergen Reactions   Gluten Meal     ITCHING, RASH, STOMACH UPSET   Latex     ITCHING      The patient states she uses none for birth control. Last LMP was No LMP recorded. Patient is perimenopausal.. Negative for Dysmenorrhea. Negative for: breast discharge, breast lump(s), breast pain and breast self exam. Associated symptoms include  abnormal vaginal bleeding. Pertinent negatives include abnormal bleeding (hematology), anxiety, decreased libido, depression, difficulty falling sleep, dyspareunia, history of infertility, nocturia, sexual dysfunction, sleep disturbances, urinary incontinence, urinary urgency, vaginal discharge and vaginal itching. Diet regular.The patient states her exercise level is  moderate.  . The patient's tobacco use is:  Social History   Tobacco Use  Smoking Status Never  Smokeless Tobacco Never  . She has been exposed to passive smoke. The patient's alcohol use is:  Social History   Substance and Sexual Activity  Alcohol Use Yes   Comment: rarely   Review of Systems  Constitutional: Negative.   HENT: Negative.    Eyes: Negative.   Respiratory: Negative.    Cardiovascular: Negative.   Gastrointestinal: Negative.   Endocrine: Negative.   Genitourinary: Negative.   Musculoskeletal:  Positive for myalgias.  Skin: Negative.   Allergic/Immunologic: Negative.   Neurological: Negative.   Hematological: Negative.   Psychiatric/Behavioral: Negative.      Today's Vitals   10/29/21 0832  BP: 118/86  Pulse: 62  Temp: 98.7 F (37.1 C)  Weight: 177 lb 6.4 oz (80.5 kg)  PainSc: 0-No pain   Body mass index is 33.52 kg/m.  Wt Readings from Last 3 Encounters:  10/29/21 177 lb 6.4 oz (80.5 kg)  10/21/20 173 lb 9.6 oz (78.7 kg)  10/11/19 178 lb 9.6 oz (81 kg)     Objective:  Physical Exam Vitals and nursing note reviewed.  Constitutional:      Appearance: Normal appearance.  HENT:     Head: Normocephalic and atraumatic.     Right Ear: Tympanic membrane, ear canal and external ear normal.     Left Ear: Tympanic membrane, ear canal and external ear normal.     Nose:     Comments: Masked     Mouth/Throat:     Comments: Masked  Eyes:     Extraocular Movements: Extraocular movements intact.     Conjunctiva/sclera: Conjunctivae normal.     Pupils: Pupils are equal, round, and reactive to  light.  Cardiovascular:     Rate and Rhythm: Normal rate and regular rhythm.     Pulses: Normal pulses.     Heart sounds: Normal heart sounds.  Pulmonary:     Effort: Pulmonary effort is normal.     Breath sounds: Normal breath sounds.  Chest:  Breasts:    Tanner Score is 5.     Right: Normal.     Left: Normal.  Abdominal:     General: Abdomen is flat. Bowel sounds are normal.     Palpations: Abdomen is soft.  Genitourinary:    Comments: deferred Musculoskeletal:        General: Normal range of motion.     Cervical back: Normal range of motion and neck supple.  Skin:    General: Skin is warm and dry.  Neurological:     General: No focal deficit present.     Mental Status: She is alert and oriented to person, place, and time.  Psychiatric:        Mood and Affect: Mood normal.        Behavior: Behavior normal.        Assessment And Plan:     1. Encounter for general adult medical examination w/o abnormal findings Comments: A full exam was performed. Importance of monthly self breast exams was discussed with the patient. PATIENT IS ADVISED TO GET 30-45 MINUTES REGULAR EXERCISE NO LESS THAN FOUR TO FIVE DAYS PER WEEK - BOTH WEIGHTBEARING EXERCISES AND AEROBIC ARE RECOMMENDED.  PATIENT IS ADVISED TO FOLLOW A HEALTHY DIET WITH AT LEAST SIX FRUITS/VEGGIES PER DAY, DECREASE INTAKE OF RED MEAT, AND TO INCREASE FISH INTAKE TO TWO DAYS PER WEEK.  MEATS/FISH SHOULD NOT BE FRIED, BAKED OR BROILED IS PREFERABLE.  IT IS ALSO IMPORTANT TO CUT BACK ON YOUR SUGAR INTAKE. PLEASE AVOID ANYTHING WITH ADDED SUGAR, CORN SYRUP OR OTHER SWEETENERS. IF YOU MUST USE A SWEETENER, YOU CAN TRY STEVIA. IT IS ALSO IMPORTANT TO AVOID ARTIFICIALLY SWEETENERS AND DIET BEVERAGES. LASTLY, I SUGGEST WEARING SPF 50 SUNSCREEN ON EXPOSED PARTS AND ESPECIALLY WHEN IN THE DIRECT SUNLIGHT FOR AN EXTENDED PERIOD OF TIME.  PLEASE AVOID FAST FOOD RESTAURANTS AND INCREASE YOUR WATER INTAKE.  - CMP14+EGFR - CBC - Hemoglobin  A1c - Lipid panel  2. Myalgia Comments: I will check labs as listed below. She is encouraged to stay well hydrated. she may benefit from magnesium supplementation.  - CK, total - ANA, IFA (with reflex) - TSH  3. Vitamin D deficiency Comments: I will check a vitamin D level and supplement as needed.  - Vitamin D (25 hydroxy)  4. Immunization due Comments: She was given Tdap to update her immunization history.  - Tdap (BOOSTRIX) injection 0.5 mL  Patient was given opportunity to ask questions. Patient verbalized  understanding of the plan and was able to repeat key elements of the plan. All questions were answered to their satisfaction.   I, Maximino Greenland, MD, have reviewed all documentation for this visit. The documentation on 11/22/21 for the exam, diagnosis, procedures, and orders are all accurate and complete.   THE PATIENT IS ENCOURAGED TO PRACTICE SOCIAL DISTANCING DUE TO THE COVID-19 PANDEMIC.

## 2021-10-29 NOTE — Patient Instructions (Signed)

## 2021-10-30 ENCOUNTER — Encounter: Payer: Self-pay | Admitting: Internal Medicine

## 2021-11-02 LAB — CMP14+EGFR
ALT: 13 IU/L (ref 0–32)
AST: 22 IU/L (ref 0–40)
Albumin/Globulin Ratio: 1.6 (ref 1.2–2.2)
Albumin: 4.2 g/dL (ref 3.8–4.8)
Alkaline Phosphatase: 68 IU/L (ref 44–121)
BUN/Creatinine Ratio: 11 — ABNORMAL LOW (ref 12–28)
BUN: 10 mg/dL (ref 8–27)
Bilirubin Total: 0.4 mg/dL (ref 0.0–1.2)
CO2: 23 mmol/L (ref 20–29)
Calcium: 9 mg/dL (ref 8.7–10.3)
Chloride: 103 mmol/L (ref 96–106)
Creatinine, Ser: 0.89 mg/dL (ref 0.57–1.00)
Globulin, Total: 2.6 g/dL (ref 1.5–4.5)
Glucose: 85 mg/dL (ref 70–99)
Potassium: 4.7 mmol/L (ref 3.5–5.2)
Sodium: 141 mmol/L (ref 134–144)
Total Protein: 6.8 g/dL (ref 6.0–8.5)
eGFR: 73 mL/min/{1.73_m2} (ref >59)

## 2021-11-02 LAB — CBC
Hematocrit: 42.6 % (ref 34.0–46.6)
Hemoglobin: 13.9 g/dL (ref 11.1–15.9)
MCH: 29.6 pg (ref 26.6–33.0)
MCHC: 32.6 g/dL (ref 31.5–35.7)
MCV: 91 fL (ref 79–97)
Platelets: 275 10*3/uL (ref 150–450)
RBC: 4.7 x10E6/uL (ref 3.77–5.28)
RDW: 11.7 % (ref 11.7–15.4)
WBC: 3.7 10*3/uL (ref 3.4–10.8)

## 2021-11-02 LAB — FANA STAINING PATTERNS: Homogeneous Pattern: 1:80 {titer}

## 2021-11-02 LAB — CK: Total CK: 150 U/L (ref 32–182)

## 2021-11-02 LAB — VITAMIN D 25 HYDROXY (VIT D DEFICIENCY, FRACTURES): Vit D, 25-Hydroxy: 53.5 ng/mL (ref 30.0–100.0)

## 2021-11-02 LAB — TSH: TSH: 1.16 u[IU]/mL (ref 0.450–4.500)

## 2021-11-02 LAB — ANTINUCLEAR ANTIBODIES, IFA: ANA Titer 1: POSITIVE — AB

## 2021-11-02 LAB — HEMOGLOBIN A1C
Est. average glucose Bld gHb Est-mCnc: 111 mg/dL
Hgb A1c MFr Bld: 5.5 % (ref 4.8–5.6)

## 2021-11-03 ENCOUNTER — Encounter: Payer: Self-pay | Admitting: Internal Medicine

## 2021-11-04 DIAGNOSIS — Z1231 Encounter for screening mammogram for malignant neoplasm of breast: Secondary | ICD-10-CM

## 2021-11-10 ENCOUNTER — Ambulatory Visit
Admission: RE | Admit: 2021-11-10 | Discharge: 2021-11-10 | Disposition: A | Discharge status: Home / Self Care | Payer: 59 | Source: Ambulatory Visit | Attending: Internal Medicine | Admitting: Internal Medicine

## 2021-11-10 DIAGNOSIS — Z1231 Encounter for screening mammogram for malignant neoplasm of breast: Secondary | ICD-10-CM

## 2022-06-29 DIAGNOSIS — N760 Acute vaginitis: Secondary | ICD-10-CM | POA: Diagnosis not present

## 2022-10-27 ENCOUNTER — Other Ambulatory Visit: Payer: Self-pay | Admitting: Internal Medicine

## 2022-10-27 DIAGNOSIS — Z6833 Body mass index (BMI) 33.0-33.9, adult: Secondary | ICD-10-CM | POA: Diagnosis not present

## 2022-10-27 DIAGNOSIS — Z1239 Encounter for other screening for malignant neoplasm of breast: Secondary | ICD-10-CM | POA: Diagnosis not present

## 2022-10-27 DIAGNOSIS — Z01419 Encounter for gynecological examination (general) (routine) without abnormal findings: Secondary | ICD-10-CM | POA: Diagnosis not present

## 2022-10-27 DIAGNOSIS — Z1211 Encounter for screening for malignant neoplasm of colon: Secondary | ICD-10-CM | POA: Diagnosis not present

## 2022-10-27 DIAGNOSIS — Z1231 Encounter for screening mammogram for malignant neoplasm of breast: Secondary | ICD-10-CM

## 2022-10-27 DIAGNOSIS — Z124 Encounter for screening for malignant neoplasm of cervix: Secondary | ICD-10-CM | POA: Diagnosis not present

## 2022-11-16 ENCOUNTER — Encounter: Payer: Self-pay | Admitting: Internal Medicine

## 2022-11-25 ENCOUNTER — Ambulatory Visit (INDEPENDENT_AMBULATORY_CARE_PROVIDER_SITE_OTHER): Payer: 59 | Admitting: Internal Medicine

## 2022-11-25 ENCOUNTER — Encounter: Payer: Self-pay | Admitting: Internal Medicine

## 2022-11-25 VITALS — BP 118/80 | HR 95 | Temp 98.2°F | Ht 61.0 in | Wt 180.0 lb

## 2022-11-25 DIAGNOSIS — H9202 Otalgia, left ear: Secondary | ICD-10-CM | POA: Diagnosis not present

## 2022-11-25 DIAGNOSIS — Z0000 Encounter for general adult medical examination without abnormal findings: Secondary | ICD-10-CM

## 2022-11-25 DIAGNOSIS — E6609 Other obesity due to excess calories: Secondary | ICD-10-CM | POA: Diagnosis not present

## 2022-11-25 DIAGNOSIS — Z6834 Body mass index (BMI) 34.0-34.9, adult: Secondary | ICD-10-CM

## 2022-11-25 DIAGNOSIS — Z1211 Encounter for screening for malignant neoplasm of colon: Secondary | ICD-10-CM

## 2022-11-25 DIAGNOSIS — E66811 Obesity, class 1: Secondary | ICD-10-CM

## 2022-11-25 DIAGNOSIS — R002 Palpitations: Secondary | ICD-10-CM | POA: Diagnosis not present

## 2022-11-25 DIAGNOSIS — L700 Acne vulgaris: Secondary | ICD-10-CM

## 2022-11-25 MED ORDER — ACYCLOVIR 5 % EX OINT: 1.0000 | TOPICAL_OINTMENT | CUTANEOUS | 1 refills | Status: AC

## 2022-11-25 NOTE — Progress Notes (Signed)
[name],acting as a Education administrator for Maximino Greenland, MD.,have documented all relevant documentation on the behalf of Maximino Greenland, MD,as directed by  Maximino Greenland, MD while in the presence of Maximino Greenland, MD.   Subjective:     Patient ID: Kathleen Donovan , female    DOB: [birth year] , 61 y.o.   MRN: [id number]   Chief Complaint  Patient presents with   Annual Exam    HPI  She is here today for a full physical examination. She is followed by Earnstine Regal, PA for her pelvic exams. She would like to have her ears and left shoulder evaluated. She denies recent URI sx, no fever/chills. There is a bump on her shoulder that needs to be drained.      Past Medical History:  Diagnosis Date   Dysfunctional uterine bleeding    Fibroids    uterine   Seasonal allergies    Varicose veins    BOTH LEGS     Family History  Problem Relation Age of Onset   Ovarian cancer Mother    Prostate cancer Father    Hypertension Sister    Asthma Sister    Obesity Sister      Current Outpatient Medications:    acyclovir ointment (ZOVIRAX) 5 %, Apply 1 Application topically every 3 (three) hours., Disp: 30 g, Rfl: 1   cholecalciferol (VITAMIN D) 1000 units tablet, Take 2,000 Units by mouth daily. , Disp: , Rfl:    levocetirizine (XYZAL) 5 MG tablet, 5 mg., Disp: , Rfl:    Multiple Vitamins-Minerals (CENTRUM SILVER 50+WOMEN PO), , Disp: , Rfl:    Probiotic Product (PROBIOTIC PO), Take by mouth. Ultra Flora 1 per day, Disp: , Rfl:    Allergies  Allergen Reactions   Gluten Meal     ITCHING, RASH, STOMACH UPSET   Latex     ITCHING      The patient states she uses none for birth control. Last LMP was Patient's last menstrual period was 11/16/2022 (approximate).. Negative for Dysmenorrhea. Negative for: breast discharge, breast lump(s), breast pain and breast self exam. Associated symptoms include abnormal vaginal bleeding. Pertinent negatives include abnormal bleeding  (hematology), anxiety, decreased libido, depression, difficulty falling sleep, dyspareunia, history of infertility, nocturia, sexual dysfunction, sleep disturbances, urinary incontinence, urinary urgency, vaginal discharge and vaginal itching. Diet regular.The patient states her exercise level is    . The patient's tobacco use is:  Social History   Tobacco Use  Smoking Status Never  Smokeless Tobacco Never  . She has been exposed to passive smoke. The patient's alcohol use is:  Social History   Substance and Sexual Activity  Alcohol Use Yes   Comment: rarely    Review of Systems  Constitutional: Negative.   HENT: Negative.         Left ear issue, feels congested.   Eyes: Negative.   Respiratory: Negative.    Cardiovascular:  Positive for palpitations. Negative for chest pain.  Gastrointestinal: Negative.   Endocrine: Negative.   Genitourinary: Negative.   Musculoskeletal: Negative.   Skin: Negative.        Skin issue on right shoulder, wants to see Derm  Allergic/Immunologic: Negative.   Neurological: Negative.   Hematological: Negative.   Psychiatric/Behavioral: Negative.       Today's Vitals   11/25/22 0904  BP: 118/80  Pulse: 95  Temp: 98.2 F (36.8 C)  Weight: 180 lb (81.6 kg)  Height: _0  (1.549 m)  PainSc:  0-No pain   Body mass index is 34.01 kg/m.  Wt Readings from Last 3 Encounters:  11/25/22 180 lb (81.6 kg)  10/29/21 177 lb 6.4 oz (80.5 kg)  10/21/20 173 lb 9.6 oz (78.7 kg)     Objective:  Physical Exam Vitals and nursing note reviewed.  Constitutional:      Appearance: Normal appearance.  HENT:     Head: Normocephalic and atraumatic.     Right Ear: Tympanic membrane, ear canal and external ear normal.     Left Ear: Tympanic membrane, ear canal and external ear normal.     Nose:     Comments: Masked     Mouth/Throat:     Comments: Masked  Eyes:     Extraocular Movements: Extraocular movements intact.     Conjunctiva/sclera: Conjunctivae  normal.     Pupils: Pupils are equal, round, and reactive to light.  Cardiovascular:     Rate and Rhythm: Normal rate and regular rhythm.     Pulses: Normal pulses.     Heart sounds: Normal heart sounds.  Pulmonary:     Effort: Pulmonary effort is normal.     Breath sounds: Normal breath sounds.  Chest:  Breasts:    Tanner Score is 5.     Right: Normal.     Left: Normal.  Abdominal:     General: Abdomen is flat. Bowel sounds are normal.     Palpations: Abdomen is soft.  Genitourinary:    Comments: deferred Musculoskeletal:        General: Normal range of motion.     Cervical back: Normal range of motion and neck supple.  Skin:    General: Skin is warm and dry.     Comments: Raised, well circumscribed lesion on right shoulder. No surrounding erythema.  Neurological:     General: No focal deficit present.     Mental Status: She is alert and oriented to person, place, and time.  Psychiatric:        Mood and Affect: Mood normal.        Behavior: Behavior normal.      Assessment And Plan:     1. Encounter for general adult medical examination w/o abnormal findings Comments: A full exam was performed. She will continue to perform monthly SBE. I will refer her to GI for CRC screening.  PATIENT IS ADVISED TO GET 30-45 MINUTES REGULAR EXERCISE NO LESS THAN FOUR TO FIVE DAYS PER WEEK - BOTH WEIGHTBEARING EXERCISES AND AEROBIC ARE RECOMMENDED.  PATIENT IS ADVISED TO FOLLOW A HEALTHY DIET WITH AT LEAST SIX FRUITS/VEGGIES PER DAY, DECREASE INTAKE OF RED MEAT, AND TO INCREASE FISH INTAKE TO TWO DAYS PER WEEK.  MEATS/FISH SHOULD NOT BE FRIED, BAKED OR BROILED IS PREFERABLE.  IT IS ALSO IMPORTANT TO CUT BACK ON YOUR SUGAR INTAKE. PLEASE AVOID ANYTHING WITH ADDED SUGAR, CORN SYRUP OR OTHER SWEETENERS. IF YOU MUST USE A SWEETENER, YOU CAN TRY STEVIA. IT IS ALSO IMPORTANT TO AVOID ARTIFICIALLY SWEETENERS AND DIET BEVERAGES. LASTLY, I SUGGEST WEARING SPF 50 SUNSCREEN ON EXPOSED PARTS AND ESPECIALLY  WHEN IN THE DIRECT SUNLIGHT FOR AN EXTENDED PERIOD OF TIME.  PLEASE AVOID FAST FOOD RESTAURANTS AND INCREASE YOUR WATER INTAKE. - CMP14+EGFR - CBC - Hemoglobin A1c - Lipid panel - TSH - Vitamin D (25 hydroxy)  2. Ear discomfort, left Comments: No abnormalities noted on exam. She was given samples of Norel AD to take prn. She will let me know if her sx persist.  3.  Palpitations Comments: I will check Mg/TSH level today. She declines sleep study evaluation. If persistent, will consider CArdiology evaluation. May benefit from Mg - TSH - Magnesium  4. [place] Comments: I will refer her to Derm for further evaluation/drainage. - Ambulatory referral to Dermatology  5. Class 1 obesity due to excess calories with serious comorbidity and body mass index (BMI) of 34.0 to 34.9 in adult Comments: She is encouraged to aim for at least 150 minutes of exercise/week, while initially striving for BMI<30 to decrease cardiac risk.  6. Screen for colon cancer - Ambulatory referral to Gastroenterology  Patient was given opportunity to ask questions. Patient verbalized understanding of the plan and was able to repeat key elements of the plan. All questions were answered to their satisfaction.   I, Maximino Greenland, MD, have reviewed all documentation for this visit. The documentation on 11/25/22 for the exam, diagnosis, procedures, and orders are all accurate and complete.   THE PATIENT IS ENCOURAGED TO PRACTICE SOCIAL DISTANCING DUE TO THE COVID-19 PANDEMIC.

## 2022-11-25 NOTE — Patient Instructions (Addendum)
Magnesium glycinate '125mg'$ -'250mg'$  nightly  Nattokinase and COVID  The 10-year ASCVD risk score (Arnett DK, et al., 2019) is: 4.7%   Values used to calculate the score:     Age: 61 years     Sex: Female     Is Non-Hispanic African American: Yes     Diabetic: No     Tobacco smoker: No     Systolic Blood Pressure: 623 mmHg     Is BP treated: No     HDL Cholesterol: 81 mg/dL     Total Cholesterol: 199 mg/dL   Health Maintenance, Female Adopting a healthy lifestyle and getting preventive care are important in promoting health and wellness. Ask your health care provider about: The right schedule for you to have regular tests and exams. Things you can do on your own to prevent diseases and keep yourself healthy. What should I know about diet, weight, and exercise? Eat a healthy diet  Eat a diet that includes plenty of vegetables, fruits, low-fat dairy products, and lean protein. Do not eat a lot of foods that are high in solid fats, added sugars, or sodium. Maintain a healthy weight Body mass index (BMI) is used to identify weight problems. It estimates body fat based on height and weight. Your health care provider can help determine your BMI and help you achieve or maintain a healthy weight. Get regular exercise Get regular exercise. This is one of the most important things you can do for your health. Most adults should: Exercise for at least 150 minutes each week. The exercise should increase your heart rate and make you sweat (moderate-intensity exercise). Do strengthening exercises at least twice a week. This is in addition to the moderate-intensity exercise. Spend less time sitting. Even light physical activity can be beneficial. Watch cholesterol and blood lipids Have your blood tested for lipids and cholesterol at 63 years of age, then have this test every 5 years. Have your cholesterol levels checked more often if: Your lipid or cholesterol levels are high. You are older than 63  years of age. You are at high risk for heart disease. What should I know about cancer screening? Depending on your health history and family history, you may need to have cancer screening at various ages. This may include screening for: Breast cancer. Cervical cancer. Colorectal cancer. Skin cancer. Lung cancer. What should I know about heart disease, diabetes, and high blood pressure? Blood pressure and heart disease High blood pressure causes heart disease and increases the risk of stroke. This is more likely to develop in people who have high blood pressure readings or are overweight. Have your blood pressure checked: Every 3-5 years if you are 4-53 years of age. Every year if you are 19 years old or older. Diabetes Have regular diabetes screenings. This checks your fasting blood sugar level. Have the screening done: Once every three years after age 25 if you are at a normal weight and have a low risk for diabetes. More often and at a younger age if you are overweight or have a high risk for diabetes. What should I know about preventing infection? Hepatitis B If you have a higher risk for hepatitis B, you should be screened for this virus. Talk with your health care provider to find out if you are at risk for hepatitis B infection. Hepatitis C Testing is recommended for: Everyone born from [birth year] through 1967. Anyone with known risk factors for hepatitis C. Sexually transmitted infections (STIs) Get screened for STIs,  including gonorrhea and chlamydia, if: You are sexually active and are younger than 63 years of age. You are older than 63 years of age and your health care provider tells you that you are at risk for this type of infection. Your sexual activity has changed since you were last screened, and you are at increased risk for chlamydia or gonorrhea. Ask your health care provider if you are at risk. Ask your health care provider about whether you are at high risk for HIV. Your  health care provider may recommend a prescription medicine to help prevent HIV infection. If you choose to take medicine to prevent HIV, you should first get tested for HIV. You should then be tested every 3 months for as long as you are taking the medicine. Pregnancy If you are about to stop having your period (premenopausal) and you may become pregnant, seek counseling before you get pregnant. Take 400 to 800 micrograms (mcg) of folic acid every day if you become pregnant. Ask for birth control (contraception) if you want to prevent pregnancy. Osteoporosis and menopause Osteoporosis is a disease in which the bones lose minerals and strength with aging. This can result in bone fractures. If you are 43 years old or older, or if you are at risk for osteoporosis and fractures, ask your health care provider if you should: Be screened for bone loss. Take a calcium or vitamin D supplement to lower your risk of fractures. Be given hormone replacement therapy (HRT) to treat symptoms of menopause. Follow these instructions at home: Alcohol use Do not drink alcohol if: Your health care provider tells you not to drink. You are pregnant, may be pregnant, or are planning to become pregnant. If you drink alcohol: Limit how much you have to: 0-1 drink a day. Know how much alcohol is in your drink. In the [place] one drink equals one 12 oz bottle of beer (355 mL), one 5 oz glass of wine (148 mL), or one 1 oz glass of hard liquor (44 mL). Lifestyle Do not use any products that contain nicotine or tobacco. These products include cigarettes, chewing tobacco, and vaping devices, such as e-cigarettes. If you need help quitting, ask your health care provider. Do not use street drugs. Do not share needles. Ask your health care provider for help if you need support or information about quitting drugs. General instructions Schedule regular health, dental, and eye exams. Stay current with your vaccines. Tell your  health care provider if: You often feel depressed. You have ever been abused or do not feel safe at home. Summary Adopting a healthy lifestyle and getting preventive care are important in promoting health and wellness. Follow your health care provider's instructions about healthy diet, exercising, and getting tested or screened for diseases. Follow your health care provider's instructions on monitoring your cholesterol and blood pressure. This information is not intended to replace advice given to you by your health care provider. Make sure you discuss any questions you have with your health care provider. Document Revised: 04/20/2021 Document Reviewed: 04/20/2021 Elsevier Patient Education  [place].

## 2022-11-26 ENCOUNTER — Encounter: Payer: Self-pay | Admitting: Internal Medicine

## 2022-11-26 LAB — CBC
Hematocrit: 43.1 % (ref 34.0–46.6)
Hemoglobin: 14 g/dL (ref 11.1–15.9)
MCH: 28.5 pg (ref 26.6–33.0)
MCHC: 32.5 g/dL (ref 31.5–35.7)
MCV: 88 fL (ref 79–97)
Platelets: 292 10*3/uL (ref 150–450)
RBC: 4.91 x10E6/uL (ref 3.77–5.28)
RDW: 11.7 % (ref 11.7–15.4)
WBC: 4 10*3/uL (ref 3.4–10.8)

## 2022-11-26 LAB — LIPID PANEL
Chol/HDL Ratio: 2.6 ratio (ref 0.0–4.4)
Cholesterol, Total: 196 mg/dL (ref 100–199)
HDL: 75 mg/dL (ref >39)
LDL Chol Calc (NIH): 106 mg/dL — ABNORMAL HIGH (ref 0–99)
Triglycerides: 81 mg/dL (ref 0–149)
VLDL Cholesterol Cal: 15 mg/dL (ref 5–40)

## 2022-11-26 LAB — CMP14+EGFR
ALT: 15 IU/L (ref 0–32)
AST: 22 IU/L (ref 0–40)
Albumin/Globulin Ratio: 1.7 (ref 1.2–2.2)
Albumin: 4.4 g/dL (ref 3.9–4.9)
Alkaline Phosphatase: 66 IU/L (ref 44–121)
BUN/Creatinine Ratio: 17 (ref 12–28)
BUN: 15 mg/dL (ref 8–27)
Bilirubin Total: 0.4 mg/dL (ref 0.0–1.2)
CO2: 21 mmol/L (ref 20–29)
Calcium: 9.4 mg/dL (ref 8.7–10.3)
Chloride: 101 mmol/L (ref 96–106)
Creatinine, Ser: 0.86 mg/dL (ref 0.57–1.00)
Globulin, Total: 2.6 g/dL (ref 1.5–4.5)
Glucose: 87 mg/dL (ref 70–99)
Potassium: 4.4 mmol/L (ref 3.5–5.2)
Sodium: 139 mmol/L (ref 134–144)
Total Protein: 7 g/dL (ref 6.0–8.5)
eGFR: 76 mL/min/{1.73_m2} (ref >59)

## 2022-11-26 LAB — TSH: TSH: 1.06 u[IU]/mL (ref 0.450–4.500)

## 2022-11-26 LAB — HEMOGLOBIN A1C
Est. average glucose Bld gHb Est-mCnc: 117 mg/dL
Hgb A1c MFr Bld: 5.7 % — ABNORMAL HIGH (ref 4.8–5.6)

## 2022-11-26 LAB — MAGNESIUM: Magnesium: 2.2 mg/dL (ref 1.6–2.3)

## 2022-11-26 LAB — VITAMIN D 25 HYDROXY (VIT D DEFICIENCY, FRACTURES): Vit D, 25-Hydroxy: 58.5 ng/mL (ref 30.0–100.0)

## 2022-12-28 ENCOUNTER — Other Ambulatory Visit: Payer: Self-pay | Admitting: Internal Medicine

## 2022-12-28 DIAGNOSIS — Z1231 Encounter for screening mammogram for malignant neoplasm of breast: Secondary | ICD-10-CM

## 2023-01-04 DIAGNOSIS — H40013 Open angle with borderline findings, low risk, bilateral: Secondary | ICD-10-CM | POA: Diagnosis not present

## 2023-01-04 DIAGNOSIS — H25813 Combined forms of age-related cataract, bilateral: Secondary | ICD-10-CM | POA: Diagnosis not present

## 2023-01-04 DIAGNOSIS — H04123 Dry eye syndrome of bilateral lacrimal glands: Secondary | ICD-10-CM | POA: Diagnosis not present

## 2023-01-11 ENCOUNTER — Other Ambulatory Visit: Payer: Self-pay | Admitting: Internal Medicine

## 2023-01-11 DIAGNOSIS — Z1231 Encounter for screening mammogram for malignant neoplasm of breast: Secondary | ICD-10-CM

## 2023-02-03 DIAGNOSIS — L0211 Cutaneous abscess of neck: Secondary | ICD-10-CM | POA: Diagnosis not present

## 2023-02-24 ENCOUNTER — Encounter: Payer: Self-pay | Admitting: Internal Medicine

## 2023-03-01 ENCOUNTER — Ambulatory Visit
Admission: RE | Admit: 2023-03-01 | Discharge: 2023-03-01 | Disposition: A | Discharge status: Home / Self Care | Payer: Commercial Managed Care - PPO | Source: Ambulatory Visit

## 2023-03-01 DIAGNOSIS — Z1231 Encounter for screening mammogram for malignant neoplasm of breast: Secondary | ICD-10-CM | POA: Diagnosis not present

## 2023-03-04 ENCOUNTER — Other Ambulatory Visit: Payer: Self-pay | Admitting: Internal Medicine

## 2023-03-04 DIAGNOSIS — R928 Other abnormal and inconclusive findings on diagnostic imaging of breast: Secondary | ICD-10-CM

## 2023-03-14 ENCOUNTER — Other Ambulatory Visit: Payer: Self-pay | Admitting: Internal Medicine

## 2023-03-14 ENCOUNTER — Ambulatory Visit
Admission: RE | Admit: 2023-03-14 | Discharge: 2023-03-14 | Disposition: A | Discharge status: Home / Self Care | Payer: Commercial Managed Care - PPO | Source: Ambulatory Visit | Attending: Internal Medicine | Admitting: Internal Medicine

## 2023-03-14 DIAGNOSIS — R928 Other abnormal and inconclusive findings on diagnostic imaging of breast: Secondary | ICD-10-CM

## 2023-03-14 DIAGNOSIS — N6321 Unspecified lump in the left breast, upper outer quadrant: Secondary | ICD-10-CM | POA: Diagnosis not present

## 2023-03-14 DIAGNOSIS — N6320 Unspecified lump in the left breast, unspecified quadrant: Secondary | ICD-10-CM

## 2023-03-29 ENCOUNTER — Ambulatory Visit: Payer: Commercial Managed Care - PPO | Admitting: Physician Assistant

## 2023-03-29 ENCOUNTER — Ambulatory Visit: Payer: Commercial Managed Care - PPO | Admitting: Internal Medicine

## 2023-03-29 ENCOUNTER — Encounter: Payer: Self-pay | Admitting: Internal Medicine

## 2023-03-29 ENCOUNTER — Other Ambulatory Visit (INDEPENDENT_AMBULATORY_CARE_PROVIDER_SITE_OTHER): Payer: Commercial Managed Care - PPO

## 2023-03-29 ENCOUNTER — Encounter: Payer: Self-pay | Admitting: Physician Assistant

## 2023-03-29 VITALS — BP 122/84 | HR 76 | Temp 98.3°F | Ht 61.0 in | Wt 176.6 lb

## 2023-03-29 DIAGNOSIS — M25562 Pain in left knee: Secondary | ICD-10-CM

## 2023-03-29 DIAGNOSIS — E6609 Other obesity due to excess calories: Secondary | ICD-10-CM | POA: Diagnosis not present

## 2023-03-29 DIAGNOSIS — G8929 Other chronic pain: Secondary | ICD-10-CM

## 2023-03-29 DIAGNOSIS — R7309 Other abnormal glucose: Secondary | ICD-10-CM | POA: Diagnosis not present

## 2023-03-29 DIAGNOSIS — Z6834 Body mass index (BMI) 34.0-34.9, adult: Secondary | ICD-10-CM | POA: Diagnosis not present

## 2023-03-29 NOTE — Progress Notes (Signed)
Office Visit Note   Patient: Kathleen Donovan           Date of Birth: 09/16/1961           MRN: [id number] Visit Date: 03/29/2023              Requested by: Dorothyann Peng, MD [address] [place],  [place] [address] PCP: Dorothyann Peng, MD   Assessment & Plan: Visit Diagnoses:  1. Chronic pain of left knee     Plan: Impression is left knee arthritis flareup.  Today, we discussed various treatment options to include continuation of over-the-counter NSAIDs versus intra-articular cortisone injection.  Do not feel her symptoms are bad enough to warrant injection at this time.  If they do seem to worsen she will let us know and follow-up for injection.  Call with concerns or questions in the meantime.  Follow-Up Instructions: Return if symptoms worsen or fail to improve.   Orders:  Orders Placed This Encounter  Procedures   XR KNEE 3 VIEW LEFT   No orders of the defined types were placed in this encounter.     Procedures: No procedures performed   Clinical Data: No additional findings.   Subjective: Chief Complaint  Patient presents with   Left Knee - Pain    HPI patient is a pleasant 62 year old female who comes in today with left knee pain for the past 5 weeks.  She denies any injury or change in activity, but noticed what sounds like a Baker's cyst about 3 to 4 weeks ago.  The pain she has is primarily to the posterior lateral aspect.  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  She notes occasional swelling and feeling of her knee shifting, but no mechanical symptoms.  She does take an occasional over-the-counter pain medication at the end of the day which does seem to help.  No previous cortisone injection to the left knee.  Review of Systems as detailed in HPI.  All others reviewed and are negative.   Objective: Vital Signs: There were  no vitals taken for this visit.  Physical Exam well-developed well-nourished female in no acute distress.  Alert and oriented x 3.  Ortho Exam left knee exam shows a trace effusion.  Range of motion 0 to 110 degrees.  No joint line tenderness.  Ligaments are stable.  She is neurovascular intact distally.  Specialty Comments:  No specialty comments available.  Imaging: XR KNEE 3 VIEW LEFT  Result Date: 03/29/2023 Moderate medial compartment joint space narrowing    PMFS History: Patient Active Problem List   Diagnosis Date Noted   Myalgia 10/29/2021   Vitamin D deficiency 10/29/2021   Encounter for general adult medical examination without abnormal findings 08/30/2018   Fibroids, intramural 10/24/2017   Gartner duct, cyst 10/24/2017   Thickened endometrium 10/24/2017   Past Medical History:  Diagnosis Date   Dysfunctional uterine bleeding    Fibroids    uterine   Seasonal allergies    Varicose veins    BOTH LEGS    Family History  Problem Relation Age of Onset   Ovarian cancer Mother    Prostate cancer Father    Hypertension Sister    Asthma Sister    Obesity Sister     Past Surgical History:  Procedure Laterality Date   BREAST BIOPSY  Left 04/2003   U/S Core- Benign   COLONOSCOPY  01/24/2012   Procedure: COLONOSCOPY;  Surgeon: Charna Elizabeth, MD;  Location: [facility];  Service: Endoscopy;  Laterality: N/A;   DENTAL SURGERY     DILATATION & CURRETTAGE/HYSTEROSCOPY WITH RESECTOCOPE N/A 10/25/2017   Procedure: DILATATION & CURETTAGE/HYSTEROSCOPY;  Surgeon: Hal Morales, MD;  Location: [facility];  Service: Gynecology;  Laterality: N/A;   LIPOMA EXCISION     LEFT AXILLA REGION   Social History   Occupational History   Not on file  Tobacco Use   Smoking status: Never   Smokeless tobacco: Never  Vaping Use   Vaping Use: Never used  Substance and Sexual Activity   Alcohol use: Yes    Comment: rarely   Drug use: No   Sexual activity:  Never

## 2023-03-29 NOTE — Patient Instructions (Addendum)
The 10-year ASCVD risk score (Arnett DK, et al., 2019) is: 5.2%   Values used to calculate the score:     Age: 62 years     Sex: Female     Is Non-Hispanic African American: Yes     Diabetic: No     Tobacco smoker: No     Systolic Blood Pressure: 122 mmHg     Is BP treated: No     HDL Cholesterol: 75 mg/dL     Total Cholesterol: 196 mg/dL   Exercising to Lose Weight Getting regular exercise is important for everyone. It is especially important if you are overweight. Being overweight increases your risk of heart disease, stroke, diabetes, high blood pressure, and several types of cancer. Exercising, and reducing the calories you consume, can help you lose weight and improve fitness and health. Exercise can be moderate or vigorous intensity. To lose weight, most people need to do a certain amount of moderate or vigorous-intensity exercise each week. How can exercise affect me? You lose weight when you exercise enough to burn more calories than you eat. Exercise also reduces body fat and builds muscle. The more muscle you have, the more calories you burn. Exercise also: Improves mood. Reduces stress and tension. Improves your overall fitness, flexibility, and endurance. Increases bone strength. Moderate-intensity exercise  Moderate-intensity exercise is any activity that gets you moving enough to burn at least three times more energy (calories) than if you were sitting. Examples of moderate exercise include: Walking a mile in 15 minutes. Doing light yard work. Biking at an easy pace. Most people should get at least 150 minutes of moderate-intensity exercise a week to maintain their body weight. Vigorous-intensity exercise Vigorous-intensity exercise is any activity that gets you moving enough to burn at least six times more calories than if you were sitting. When you exercise at this intensity, you should be working hard enough that you are not able to carry on a conversation. Examples  of vigorous exercise include: Running. Playing a team sport, such as football, basketball, and soccer. Jumping rope. Most people should get at least 75 minutes a week of vigorous exercise to maintain their body weight. What actions can I take to lose weight? The amount of exercise you need to lose weight depends on: Your age. The type of exercise. Any health conditions you have. Your overall physical ability. Talk to your health care provider about how much exercise you need and what types of activities are safe for you. Nutrition  Make changes to your diet as told by your health care provider or diet and nutrition specialist (dietitian). This may include: Eating fewer calories. Eating more protein. Eating less unhealthy fats. Eating a diet that includes fresh fruits and vegetables, whole grains, low-fat dairy products, and lean protein. Avoiding foods with added fat, salt, and sugar. Drink plenty of water while you exercise to prevent dehydration or heat stroke. Activity Choose an activity that you enjoy and set realistic goals. Your health care provider can help you make an exercise plan that works for you. Exercise at a moderate or vigorous intensity most days of the week. The intensity of exercise may vary from person to person. You can tell how intense a workout is for you by paying attention to your breathing and heartbeat. Most people will notice their breathing and heartbeat get faster with more intense exercise. Do resistance training twice each week, such as: Push-ups. Sit-ups. Lifting weights. Using resistance bands. Getting short amounts of exercise can  be just as helpful as long, structured periods of exercise. If you have trouble finding time to exercise, try doing these things as part of your daily routine: Get up, stretch, and walk around every 30 minutes throughout the day. Go for a walk during your lunch break. Park your car farther away from your destination. If  you take public transportation, get off one stop early and walk the rest of the way. Make phone calls while standing up and walking around. Take the stairs instead of elevators or escalators. Wear comfortable clothes and shoes with good support. Do not exercise so much that you hurt yourself, feel dizzy, or get very short of breath. Where to find more information U.S. Department of Health and Human Services: [employer]at Centers for Disease Control and Prevention: http://www.wolf.info/ Contact a health care provider: Before starting a new exercise program. If you have questions or concerns about your weight. If you have a medical problem that keeps you from exercising. Get help right away if: You have any of the following while exercising: Injury. Dizziness. Difficulty breathing or shortness of breath that does not go away when you stop exercising. Chest pain. Rapid heartbeat. These symptoms may represent a serious problem that is an emergency. Do not wait to see if the symptoms will go away. Get medical help right away. Call your local emergency services (911 in the [place] Do not drive yourself to the hospital. Summary Getting regular exercise is especially important if you are overweight. Being overweight increases your risk of heart disease, stroke, diabetes, high blood pressure, and several types of cancer. Losing weight happens when you burn more calories than you eat. Reducing the amount of calories you eat, and getting regular moderate or vigorous exercise each week, helps you lose weight. This information is not intended to replace advice given to you by your health care provider. Make sure you discuss any questions you have with your health care provider. Document Revised: 01/25/2021 Document Reviewed: 01/25/2021 Elsevier Patient Education  [place].

## 2023-03-29 NOTE — Progress Notes (Signed)
I,Victoria T Hamilton,acting as a scribe for Gwynneth Aliment, MD.,have documented all relevant documentation on the behalf of Gwynneth Aliment, MD,as directed by  Gwynneth Aliment, MD while in the presence of Gwynneth Aliment, MD.    Subjective:     Patient ID: Kathleen Donovan , female    DOB: 1961-03-31 , 62 y.o.   MRN: [id number]   Chief Complaint  Patient presents with   Weight Check    HPI  Pt presents today for weight check & A1C follow up.  Patient had labs in December. Her A1C came back as: 5.7,she has no specific questions or concerns pertaining to weight or A1C.    She is concerned about left knee pain she has been having since late Feb/March. She denies fall/trauma.  She feels as if her knee shifts at times, usually during positional changes. She does stand a lot at work. No issues with ambulation. At times, she feels there is swelling behind the knee. Wants to know if she has Baker's cyst.  She has tried ibuprofen and Copper knee sleeve with some relief of her symptoms.  She has also noticed intermittent swelling of the lateral knee.      She reports colonoscopy is scheduled for May.      Past Medical History:  Diagnosis Date   Dysfunctional uterine bleeding    Fibroids    uterine   Seasonal allergies    Varicose veins    BOTH LEGS     Family History  Problem Relation Age of Onset   Ovarian cancer Mother    Prostate cancer Father    Hypertension Sister    Asthma Sister    Obesity Sister      Current Outpatient Medications:    acyclovir ointment (ZOVIRAX) 5 %, Apply 1 Application topically every 3 (three) hours., Disp: 30 g, Rfl: 1   cholecalciferol (VITAMIN D) 1000 units tablet, Take 2,000 Units by mouth daily. , Disp: , Rfl:    levocetirizine (XYZAL) 5 MG tablet, 5 mg., Disp: , Rfl:    Multiple Vitamins-Minerals (CENTRUM SILVER 50+WOMEN PO), , Disp: , Rfl:    Probiotic Product (PROBIOTIC PO), Take by mouth. Ultra Flora 1 per day, Disp: , Rfl:    Allergies   Allergen Reactions   Gluten Meal     ITCHING, RASH, STOMACH UPSET   Latex     ITCHING     Review of Systems  Constitutional: Negative.   Respiratory: Negative.    Cardiovascular: Negative.   Neurological: Negative.   Psychiatric/Behavioral: Negative.       Today's Vitals   03/29/23 0824  BP: 122/84  Pulse: 76  Temp: 98.3 F (36.8 C)  SpO2: 98%  Weight: 176 lb 9.6 oz (80.1 kg)  Height:  (1.549 m)   Body mass index is 33.37 kg/m.  Wt Readings from Last 3 Encounters:  03/29/23 176 lb 9.6 oz (80.1 kg)  11/25/22 180 lb (81.6 kg)  10/29/21 177 lb 6.4 oz (80.5 kg)    Objective:  Physical Exam Vitals and nursing note reviewed.  Constitutional:      Appearance: Normal appearance.  HENT:     Head: Normocephalic and atraumatic.     Nose:     Comments: Masked     Mouth/Throat:     Comments: Masked  Eyes:     Extraocular Movements: Extraocular movements intact.  Cardiovascular:     Rate and Rhythm: Normal rate and regular rhythm.  Heart sounds: Normal heart sounds.  Pulmonary:     Effort: Pulmonary effort is normal.     Breath sounds: Normal breath sounds.  Musculoskeletal:        General: No tenderness.     Cervical back: Normal range of motion.     Comments: Mild crepitus left knee, sl medial swelling Left calf 14.5" R calf 14"  Skin:    General: Skin is warm.  Neurological:     General: No focal deficit present.     Mental Status: She is alert.  Psychiatric:        Mood and Affect: Mood normal.        Behavior: Behavior normal.     Assessment And Plan:     1. Class 1 obesity due to excess calories with serious comorbidity and body mass index (BMI) of 34.0 to 34.9 in adult Comments: She was congratulated on her 4lb weight loss and encouraged to keep up the great work. We agreed short term goal is 162 lbs.  2. Elevated hemoglobin A1c Comments: Her a1c was 5.7 in Dec 2023, I will recheck today.  She has been limiting her carb intake. - Hemoglobin  A1c - BMP8+eGFR  3. Acute pain of left knee Comments: I will refer her to Ortho for further eval/radiographic studies. Will need u/s to determine presence of Baker's cyst. Advised to use topical Voltaren gel prn. - Ambulatory referral to Orthopedic Surgery  Patient was given opportunity to ask questions. Patient verbalized understanding of the plan and was able to repeat key elements of the plan. All questions were answered to their satisfaction.   I, Gwynneth Aliment, MD, have reviewed all documentation for this visit. The documentation on 03/29/23 for the exam, diagnosis, procedures, and orders are all accurate and complete.   IF YOU HAVE BEEN REFERRED TO A SPECIALIST, IT MAY TAKE 1-2 WEEKS TO SCHEDULE/PROCESS THE REFERRAL. IF YOU HAVE NOT HEARD FROM US/SPECIALIST IN TWO WEEKS, PLEASE GIVE [place] A CALL AT [phone].   THE PATIENT IS ENCOURAGED TO PRACTICE SOCIAL DISTANCING DUE TO THE COVID-19 PANDEMIC.

## 2023-03-30 LAB — HEMOGLOBIN A1C
Est. average glucose Bld gHb Est-mCnc: 111 mg/dL
Hgb A1c MFr Bld: 5.5 % (ref 4.8–5.6)

## 2023-03-30 LAB — BMP8+EGFR
BUN/Creatinine Ratio: 14 (ref 12–28)
BUN: 13 mg/dL (ref 8–27)
CO2: 22 mmol/L (ref 20–29)
Calcium: 9.6 mg/dL (ref 8.7–10.3)
Chloride: 103 mmol/L (ref 96–106)
Creatinine, Ser: 0.91 mg/dL (ref 0.57–1.00)
Glucose: 84 mg/dL (ref 70–99)
Potassium: 5 mmol/L (ref 3.5–5.2)
Sodium: 139 mmol/L (ref 134–144)
eGFR: 71 mL/min/{1.73_m2} (ref >59)

## 2023-04-20 DIAGNOSIS — K621 Rectal polyp: Secondary | ICD-10-CM | POA: Diagnosis not present

## 2023-04-20 DIAGNOSIS — K635 Polyp of colon: Secondary | ICD-10-CM | POA: Diagnosis not present

## 2023-04-20 DIAGNOSIS — Z1211 Encounter for screening for malignant neoplasm of colon: Secondary | ICD-10-CM | POA: Diagnosis not present

## 2023-04-20 DIAGNOSIS — K648 Other hemorrhoids: Secondary | ICD-10-CM | POA: Diagnosis not present

## 2023-09-16 ENCOUNTER — Other Ambulatory Visit: Payer: Self-pay | Admitting: Internal Medicine

## 2023-09-16 DIAGNOSIS — Z1211 Encounter for screening for malignant neoplasm of colon: Secondary | ICD-10-CM

## 2023-09-16 DIAGNOSIS — Z1212 Encounter for screening for malignant neoplasm of rectum: Secondary | ICD-10-CM

## 2023-10-25 ENCOUNTER — Ambulatory Visit
Admission: RE | Admit: 2023-10-25 | Discharge: 2023-10-25 | Disposition: A | Discharge status: Home / Self Care | Payer: Commercial Managed Care - PPO | Source: Ambulatory Visit | Attending: Internal Medicine | Admitting: Internal Medicine

## 2023-10-25 ENCOUNTER — Other Ambulatory Visit: Payer: Self-pay | Admitting: Internal Medicine

## 2023-10-25 DIAGNOSIS — N6321 Unspecified lump in the left breast, upper outer quadrant: Secondary | ICD-10-CM | POA: Diagnosis not present

## 2023-10-25 DIAGNOSIS — N6320 Unspecified lump in the left breast, unspecified quadrant: Secondary | ICD-10-CM

## 2023-10-26 ENCOUNTER — Encounter: Payer: Self-pay | Admitting: Internal Medicine

## 2023-11-02 DIAGNOSIS — R928 Other abnormal and inconclusive findings on diagnostic imaging of breast: Secondary | ICD-10-CM | POA: Diagnosis not present

## 2023-11-02 DIAGNOSIS — Z1211 Encounter for screening for malignant neoplasm of colon: Secondary | ICD-10-CM | POA: Diagnosis not present

## 2023-11-02 DIAGNOSIS — N950 Postmenopausal bleeding: Secondary | ICD-10-CM | POA: Diagnosis not present

## 2023-11-02 DIAGNOSIS — Z01419 Encounter for gynecological examination (general) (routine) without abnormal findings: Secondary | ICD-10-CM | POA: Diagnosis not present

## 2023-11-02 DIAGNOSIS — Z1339 Encounter for screening examination for other mental health and behavioral disorders: Secondary | ICD-10-CM | POA: Diagnosis not present

## 2023-11-02 DIAGNOSIS — Z124 Encounter for screening for malignant neoplasm of cervix: Secondary | ICD-10-CM | POA: Diagnosis not present

## 2023-11-02 DIAGNOSIS — Z1239 Encounter for other screening for malignant neoplasm of breast: Secondary | ICD-10-CM | POA: Diagnosis not present

## 2023-11-23 DIAGNOSIS — N950 Postmenopausal bleeding: Secondary | ICD-10-CM | POA: Diagnosis not present

## 2023-12-26 ENCOUNTER — Ambulatory Visit (INDEPENDENT_AMBULATORY_CARE_PROVIDER_SITE_OTHER): Payer: Commercial Managed Care - PPO | Admitting: Internal Medicine

## 2023-12-26 ENCOUNTER — Encounter: Payer: Self-pay | Admitting: Internal Medicine

## 2023-12-26 VITALS — BP 124/82 | HR 91 | Temp 98.1°F | Ht 61.0 in | Wt 179.2 lb

## 2023-12-26 DIAGNOSIS — N938 Other specified abnormal uterine and vaginal bleeding: Secondary | ICD-10-CM | POA: Diagnosis not present

## 2023-12-26 DIAGNOSIS — G8929 Other chronic pain: Secondary | ICD-10-CM | POA: Insufficient documentation

## 2023-12-26 DIAGNOSIS — M25512 Pain in left shoulder: Secondary | ICD-10-CM | POA: Diagnosis not present

## 2023-12-26 DIAGNOSIS — Z0000 Encounter for general adult medical examination without abnormal findings: Secondary | ICD-10-CM | POA: Diagnosis not present

## 2023-12-26 NOTE — Assessment & Plan Note (Signed)
 A full exam was performed.  Importance of monthly self breast exams was discussed with the patient.  She is advised to get 3-45 minutes of regular exercise, no less than four to five days per week. Both weight-bearing and aerobic exercises are recommended.  She is advised to follow a healthy diet with at least six fruits/veggies per day, decrease intake of red meat and other saturated fats and to increase fish intake to twice weekly.  Meats/fish should not be fried -- baked, boiled or broiled is preferable. It is also important to cut back on your sugar intake.  Be sure to read labels - try to avoid anything with added sugar, high fructose corn syrup or other sweeteners.  If you must use a sweetener, you can try stevia or monkfruit.  It is also important to avoid artificially sweetened foods/beverages and diet drinks. Lastly, wear SPF 50 sunscreen on exposed skin and when in direct sunlight for an extended period of time.  Be sure to avoid fast food restaurants and aim for at least 60 ounces of water daily.

## 2023-12-26 NOTE — Assessment & Plan Note (Signed)
 Chronic, intermittent. It is felt it is due to poor posture  since she spends a lot of time on the computer.  She has gotten massages in the past which have helped. She may benefit from [place] (chiro) therapy and laser therapy for long-term relief. Encouraged to continue stretching regularly, may also benefit from topical pain cream to affected area nightly.

## 2023-12-26 NOTE — Patient Instructions (Addendum)
 [facility] Dr. Lennart Stallion  Health Maintenance, Female Adopting a healthy lifestyle and getting preventive care are important in promoting health and wellness. Ask your health care provider about: The right schedule for you to have regular tests and exams. Things you can do on your own to prevent diseases and keep yourself healthy. What should I know about diet, weight, and exercise? Eat a healthy diet  Eat a diet that includes plenty of vegetables, fruits, low-fat dairy products, and lean protein. Do not eat a lot of foods that are high in solid fats, added sugars, or sodium. Maintain a healthy weight Body mass index (BMI) is used to identify weight problems. It estimates body fat based on height and weight. Your health care provider can help determine your BMI and help you achieve or maintain a healthy weight. Get regular exercise Get regular exercise. This is one of the most important things you can do for your health. Most adults should: Exercise for at least 150 minutes each week. The exercise should increase your heart rate and make you sweat (moderate-intensity exercise). Do strengthening exercises at least twice a week. This is in addition to the moderate-intensity exercise. Spend less time sitting. Even light physical activity can be beneficial. Watch cholesterol and blood lipids Have your blood tested for lipids and cholesterol at 65 years of age, then have this test every 5 years. Have your cholesterol levels checked more often if: Your lipid or cholesterol levels are high. You are older than 65 years of age. You are at high risk for heart disease. What should I know about cancer screening? Depending on your health history and family history, you may need to have cancer screening at various ages. This may include screening for: Breast cancer. Cervical cancer. Colorectal cancer. Skin cancer. Lung cancer. What should I know about heart disease, diabetes, and high  blood pressure? Blood pressure and heart disease High blood pressure causes heart disease and increases the risk of stroke. This is more likely to develop in people who have high blood pressure readings or are overweight. Have your blood pressure checked: Every 3-5 years if you are 20-29 years of age. Every year if you are 18 years old or older. Diabetes Have regular diabetes screenings. This checks your fasting blood sugar level. Have the screening done: Once every three years after age 13 if you are at a normal weight and have a low risk for diabetes. More often and at a younger age if you are overweight or have a high risk for diabetes. What should I know about preventing infection? Hepatitis B If you have a higher risk for hepatitis B, you should be screened for this virus. Talk with your health care provider to find out if you are at risk for hepatitis B infection. Hepatitis C Testing is recommended for: Everyone born from [birth year] through 1967. Anyone with known risk factors for hepatitis C. Sexually transmitted infections (STIs) Get screened for STIs, including gonorrhea and chlamydia, if: You are sexually active and are younger than 65 years of age. You are older than 65 years of age and your health care provider tells you that you are at risk for this type of infection. Your sexual activity has changed since you were last screened, and you are at increased risk for chlamydia or gonorrhea. Ask your health care provider if you are at risk. Ask your health care provider about whether you are at high risk for HIV. Your health care provider may recommend a  prescription medicine to help prevent HIV infection. If you choose to take medicine to prevent HIV, you should first get tested for HIV. You should then be tested every 3 months for as long as you are taking the medicine. Pregnancy If you are about to stop having your period (premenopausal) and you may become pregnant, seek counseling  before you get pregnant. Take 400 to 800 micrograms (mcg) of folic acid every day if you become pregnant. Ask for birth control (contraception) if you want to prevent pregnancy. Osteoporosis and menopause Osteoporosis is a disease in which the bones lose minerals and strength with aging. This can result in bone fractures. If you are 70 years old or older, or if you are at risk for osteoporosis and fractures, ask your health care provider if you should: Be screened for bone loss. Take a calcium or vitamin D  supplement to lower your risk of fractures. Be given hormone replacement therapy (HRT) to treat symptoms of menopause. Follow these instructions at home: Alcohol use Do not drink alcohol if: Your health care provider tells you not to drink. You are pregnant, may be pregnant, or are planning to become pregnant. If you drink alcohol: Limit how much you have to: 0-1 drink a day. Know how much alcohol is in your drink. In the [place] one drink equals one 12 oz bottle of beer (355 mL), one 5 oz glass of wine (148 mL), or one 1 oz glass of hard liquor (44 mL). Lifestyle Do not use any products that contain nicotine or tobacco. These products include cigarettes, chewing tobacco, and vaping devices, such as e-cigarettes. If you need help quitting, ask your health care provider. Do not use street drugs. Do not share needles. Ask your health care provider for help if you need support or information about quitting drugs. General instructions Schedule regular health, dental, and eye exams. Stay current with your vaccines. Tell your health care provider if: You often feel depressed. You have ever been abused or do not feel safe at home. Summary Adopting a healthy lifestyle and getting preventive care are important in promoting health and wellness. Follow your health care provider's instructions about healthy diet, exercising, and getting tested or screened for diseases. Follow your health care  provider's instructions on monitoring your cholesterol and blood pressure. This information is not intended to replace advice given to you by your health care provider. Make sure you discuss any questions you have with your health care provider. Document Revised: 04/20/2021 Document Reviewed: 04/20/2021 Elsevier Patient Education  2024 [employer].

## 2023-12-26 NOTE — Progress Notes (Signed)
 I,Victoria T Emmitt, CMA,acting as a neurosurgeon for Catheryn LOISE Slocumb, MD.,have documented all relevant documentation on the behalf of Catheryn LOISE Slocumb, MD,as directed by  Catheryn LOISE Slocumb, MD while in the presence of Catheryn LOISE Slocumb, MD.  Subjective:    Patient ID: Kathleen Donovan , female    DOB: 05-12-1961 , 63 y.o.   MRN: [id number]  Chief Complaint  Patient presents with   Annual Exam    HPI  She is here today for a full physical examination. She is followed by Madolyn Monte, PA for her pelvic exams. She currently does not take any prescribed medications.       Past Medical History:  Diagnosis Date   Dysfunctional uterine bleeding    Fibroids    uterine   Seasonal allergies    Varicose veins    BOTH LEGS     Family History  Problem Relation Age of Onset   Ovarian cancer Mother    Prostate cancer Father    Hypertension Sister    Asthma Sister    Obesity Sister      Current Outpatient Medications:    acyclovir  ointment (ZOVIRAX ) 5 %, Apply 1 Application topically every 3 (three) hours., Disp: 30 g, Rfl: 1   cholecalciferol (VITAMIN D ) 1000 units tablet, Take 2,000 Units by mouth daily. , Disp: , Rfl:    levocetirizine (XYZAL) 5 MG tablet, 5 mg., Disp: , Rfl:    Multiple Vitamins-Minerals (CENTRUM SILVER 50+WOMEN PO), , Disp: , Rfl:    Probiotic Product (PROBIOTIC PO), Take by mouth. Ultra Flora 1 per day, Disp: , Rfl:    Allergies  Allergen Reactions   Gluten Meal     ITCHING, RASH, STOMACH UPSET   Latex     ITCHING      The patient states she uses none for birth control. No LMP recorded. Patient is perimenopausal.. Negative for Dysmenorrhea. Negative for: breast discharge, breast lump(s), breast pain and breast self exam. Associated symptoms include abnormal vaginal bleeding. Pertinent negatives include abnormal bleeding (hematology), anxiety, decreased libido, depression, difficulty falling sleep, dyspareunia, history of infertility, nocturia, sexual dysfunction,  sleep disturbances, urinary incontinence, urinary urgency, vaginal discharge and vaginal itching. Diet regular.The patient states her exercise level is  moderate.  . The patient's tobacco use is:  Social History   Tobacco Use  Smoking Status Never  Smokeless Tobacco Never  . She has been exposed to passive smoke. The patient's alcohol use is:  Social History   Substance and Sexual Activity  Alcohol Use Yes   Comment: rarely    Review of Systems  Constitutional: Negative.   HENT: Negative.    Eyes: Negative.   Respiratory: Negative.    Cardiovascular: Negative.   Endocrine: Negative.   Genitourinary: Negative.        She is still having regular menses. She is followed by GYN. Previous EMB, u/s have been normal.  She also reports h/o uterine fibroids.  Musculoskeletal: Negative.        She c/o left shoulder pain.  Intermittent. Admits it worsens when she has been on the computer for long hours. At times, she has left UE paresthesias. They usually resolve with massage therapy or postural changes.   Skin: Negative.   Allergic/Immunologic: Negative.   Neurological: Negative.   Hematological: Negative.   Psychiatric/Behavioral: Negative.       Today's Vitals   12/26/23 0839  BP: 124/82  Pulse: 91  Temp: 98.1 F (36.7 C)  SpO2: 98%  Weight:  179 lb 3.2 oz (81.3 kg)  Height: 5' 1 (1.549 m)   Body mass index is 33.86 kg/m.  Wt Readings from Last 3 Encounters:  12/26/23 179 lb 3.2 oz (81.3 kg)  03/29/23 176 lb 9.6 oz (80.1 kg)  11/25/22 180 lb (81.6 kg)     Objective:  Physical Exam Vitals and nursing note reviewed.  Constitutional:      Appearance: Normal appearance.  HENT:     Head: Normocephalic and atraumatic.     Right Ear: Tympanic membrane, ear canal and external ear normal.     Left Ear: Tympanic membrane, ear canal and external ear normal.     Nose: Nose normal.     Mouth/Throat:     Mouth: Mucous membranes are moist.     Pharynx: Oropharynx is clear.   Eyes:     Extraocular Movements: Extraocular movements intact.     Conjunctiva/sclera: Conjunctivae normal.     Pupils: Pupils are equal, round, and reactive to light.  Cardiovascular:     Rate and Rhythm: Normal rate and regular rhythm.     Pulses: Normal pulses.     Heart sounds: Normal heart sounds.  Pulmonary:     Effort: Pulmonary effort is normal.     Breath sounds: Normal breath sounds.  Chest:  Breasts:    Tanner Score is 5.     Right: Normal.     Left: Normal.     Comments: Dense breast tissue  Abdominal:     General: Abdomen is flat. Bowel sounds are normal.     Palpations: Abdomen is soft.  Genitourinary:    Comments: deferred Musculoskeletal:        General: No tenderness. Normal range of motion.     Cervical back: Normal range of motion and neck supple.  Skin:    General: Skin is warm and dry.  Neurological:     General: No focal deficit present.     Mental Status: She is alert and oriented to person, place, and time.  Psychiatric:        Mood and Affect: Mood normal.        Behavior: Behavior normal.         Assessment And Plan:     Encounter for general adult medical examination w/o abnormal findings Assessment & Plan: A full exam was performed.  Importance of monthly self breast exams was discussed with the patient.  She is advised to get 3-45 minutes of regular exercise, no less than four to five days per week. Both weight-bearing and aerobic exercises are recommended.  She is advised to follow a healthy diet with at least six fruits/veggies per day, decrease intake of red meat and other saturated fats and to increase fish intake to twice weekly.  Meats/fish should not be fried -- baked, boiled or broiled is preferable. It is also important to cut back on your sugar intake.  Be sure to read labels - try to avoid anything with added sugar, high fructose corn syrup or other sweeteners.  If you must use a sweetener, you can try stevia or monkfruit.  It is  also important to avoid artificially sweetened foods/beverages and diet drinks. Lastly, wear SPF 50 sunscreen on exposed skin and when in direct sunlight for an extended period of time.  Be sure to avoid fast food restaurants and aim for at least 60 ounces of water daily.      Orders: -     CBC -  CMP14+EGFR -     Lipid panel -     Hemoglobin A1c  Chronic left shoulder pain Assessment & Plan: Chronic, intermittent. It is felt it is due to poor posture  since she spends a lot of time on the computer.  She has gotten massages in the past which have helped. She may benefit from Graston (chiro) therapy and laser therapy for long-term relief. Encouraged to continue stretching regularly, may also benefit from topical pain cream to affected area nightly.    Dysfunctional uterine bleeding -     Follicle stimulating hormone -     TSH     Return for 1 year physical. Patient was given opportunity to ask questions. Patient verbalized understanding of the plan and was able to repeat key elements of the plan. All questions were answered to their satisfaction.    I, Catheryn LOISE Slocumb, MD, have reviewed all documentation for this visit. The documentation on 12/26/23 for the exam, diagnosis, procedures, and orders are all accurate and complete.

## 2023-12-27 LAB — CMP14+EGFR
ALT: 14 [IU]/L (ref 0–32)
AST: 21 [IU]/L (ref 0–40)
Albumin: 4.1 g/dL (ref 3.9–4.9)
Alkaline Phosphatase: 68 [IU]/L (ref 44–121)
BUN/Creatinine Ratio: 12 (ref 12–28)
BUN: 12 mg/dL (ref 8–27)
Bilirubin Total: 0.3 mg/dL (ref 0.0–1.2)
CO2: 23 mmol/L (ref 20–29)
Calcium: 9.1 mg/dL (ref 8.7–10.3)
Chloride: 99 mmol/L (ref 96–106)
Creatinine, Ser: 0.97 mg/dL (ref 0.57–1.00)
Globulin, Total: 2.4 g/dL (ref 1.5–4.5)
Glucose: 83 mg/dL (ref 70–99)
Potassium: 4.5 mmol/L (ref 3.5–5.2)
Sodium: 136 mmol/L (ref 134–144)
Total Protein: 6.5 g/dL (ref 6.0–8.5)
eGFR: 65 mL/min/{1.73_m2} (ref >59)

## 2023-12-27 LAB — CBC
Hematocrit: 45.3 % (ref 34.0–46.6)
Hemoglobin: 14.2 g/dL (ref 11.1–15.9)
MCH: 29.3 pg (ref 26.6–33.0)
MCHC: 31.3 g/dL — ABNORMAL LOW (ref 31.5–35.7)
MCV: 93 fL (ref 79–97)
Platelets: 303 10*3/uL (ref 150–450)
RBC: 4.85 x10E6/uL (ref 3.77–5.28)
RDW: 11.9 % (ref 11.7–15.4)
WBC: 4.4 10*3/uL (ref 3.4–10.8)

## 2023-12-27 LAB — HEMOGLOBIN A1C
Est. average glucose Bld gHb Est-mCnc: 111 mg/dL
Hgb A1c MFr Bld: 5.5 % (ref 4.8–5.6)

## 2023-12-27 LAB — FOLLICLE STIMULATING HORMONE: FSH: 35.1 m[IU]/mL (ref 25.8–134.8)

## 2023-12-27 LAB — LIPID PANEL
Chol/HDL Ratio: 2.6 {ratio} (ref 0.0–4.4)
Cholesterol, Total: 205 mg/dL — ABNORMAL HIGH (ref 100–199)
HDL: 79 mg/dL (ref >39)
LDL Chol Calc (NIH): 112 mg/dL — ABNORMAL HIGH (ref 0–99)
Triglycerides: 77 mg/dL (ref 0–149)
VLDL Cholesterol Cal: 14 mg/dL (ref 5–40)

## 2023-12-27 LAB — TSH: TSH: 1.3 u[IU]/mL (ref 0.450–4.500)

## 2024-01-11 DIAGNOSIS — H25813 Combined forms of age-related cataract, bilateral: Secondary | ICD-10-CM | POA: Diagnosis not present

## 2024-01-11 DIAGNOSIS — H04123 Dry eye syndrome of bilateral lacrimal glands: Secondary | ICD-10-CM | POA: Diagnosis not present

## 2024-01-11 DIAGNOSIS — H40023 Open angle with borderline findings, high risk, bilateral: Secondary | ICD-10-CM | POA: Diagnosis not present

## 2024-01-12 DIAGNOSIS — N950 Postmenopausal bleeding: Secondary | ICD-10-CM | POA: Diagnosis not present

## 2024-01-12 DIAGNOSIS — Z8041 Family history of malignant neoplasm of ovary: Secondary | ICD-10-CM | POA: Diagnosis not present

## 2024-01-12 DIAGNOSIS — Z6830 Body mass index (BMI) 30.0-30.9, adult: Secondary | ICD-10-CM | POA: Diagnosis not present

## 2024-02-07 DIAGNOSIS — N950 Postmenopausal bleeding: Secondary | ICD-10-CM | POA: Diagnosis not present

## 2024-02-07 DIAGNOSIS — Z30430 Encounter for insertion of intrauterine contraceptive device: Secondary | ICD-10-CM | POA: Diagnosis not present

## 2024-02-14 DIAGNOSIS — N950 Postmenopausal bleeding: Secondary | ICD-10-CM | POA: Diagnosis not present

## 2024-02-14 DIAGNOSIS — Z30431 Encounter for routine checking of intrauterine contraceptive device: Secondary | ICD-10-CM | POA: Diagnosis not present

## 2024-03-06 ENCOUNTER — Ambulatory Visit
Admission: RE | Admit: 2024-03-06 | Discharge: 2024-03-06 | Disposition: A | Discharge status: Home / Self Care | Payer: Commercial Managed Care - PPO | Source: Ambulatory Visit | Attending: Internal Medicine | Admitting: Internal Medicine

## 2024-03-06 DIAGNOSIS — N6320 Unspecified lump in the left breast, unspecified quadrant: Secondary | ICD-10-CM

## 2024-03-06 DIAGNOSIS — N6321 Unspecified lump in the left breast, upper outer quadrant: Secondary | ICD-10-CM | POA: Diagnosis not present

## 2024-10-19 ENCOUNTER — Other Ambulatory Visit: Payer: Self-pay

## 2024-10-19 MED ORDER — COVID-19 MRNA VAC-TRIS(PFIZER) 30 MCG/0.3ML IM SUSY
0.3000 mL | PREFILLED_SYRINGE | Freq: Once | INTRAMUSCULAR | 0 refills | Status: AC
  Filled 2024-10-19: qty 0.3, 1d supply, fill #0

## 2024-11-14 DIAGNOSIS — Z1211 Encounter for screening for malignant neoplasm of colon: Secondary | ICD-10-CM | POA: Diagnosis not present

## 2024-11-14 DIAGNOSIS — Z1231 Encounter for screening mammogram for malignant neoplasm of breast: Secondary | ICD-10-CM | POA: Diagnosis not present

## 2024-11-14 DIAGNOSIS — Z13820 Encounter for screening for osteoporosis: Secondary | ICD-10-CM | POA: Diagnosis not present

## 2024-11-14 DIAGNOSIS — Z01419 Encounter for gynecological examination (general) (routine) without abnormal findings: Secondary | ICD-10-CM | POA: Diagnosis not present

## 2024-11-14 DIAGNOSIS — Z124 Encounter for screening for malignant neoplasm of cervix: Secondary | ICD-10-CM | POA: Diagnosis not present

## 2024-11-14 DIAGNOSIS — Z1330 Encounter for screening examination for mental health and behavioral disorders, unspecified: Secondary | ICD-10-CM | POA: Diagnosis not present

## 2024-11-20 ENCOUNTER — Other Ambulatory Visit (HOSPITAL_BASED_OUTPATIENT_CLINIC_OR_DEPARTMENT_OTHER): Payer: Self-pay | Admitting: Obstetrics and Gynecology

## 2024-11-20 DIAGNOSIS — Z13820 Encounter for screening for osteoporosis: Secondary | ICD-10-CM

## 2024-11-21 LAB — HM PAP SMEAR

## 2024-12-27 ENCOUNTER — Encounter: Payer: Self-pay | Admitting: Internal Medicine

## 2024-12-27 ENCOUNTER — Ambulatory Visit (INDEPENDENT_AMBULATORY_CARE_PROVIDER_SITE_OTHER): Payer: Commercial Managed Care - PPO | Admitting: Internal Medicine

## 2024-12-27 VITALS — BP 122/80 | HR 86 | Temp 98.3°F | Ht 61.0 in | Wt 186.0 lb

## 2024-12-27 DIAGNOSIS — L818 Other specified disorders of pigmentation: Secondary | ICD-10-CM | POA: Diagnosis not present

## 2024-12-27 DIAGNOSIS — Z0000 Encounter for general adult medical examination without abnormal findings: Secondary | ICD-10-CM

## 2024-12-27 MED ORDER — LEVONORGESTREL 20 MCG/DAY IU IUD: 1.0000 | INTRAUTERINE_SYSTEM | Freq: Once | INTRAUTERINE | Status: AC

## 2024-12-27 NOTE — Patient Instructions (Signed)

## 2024-12-27 NOTE — Assessment & Plan Note (Signed)

## 2024-12-27 NOTE — Progress Notes (Signed)
 I,Victoria T Emmitt, CMA,acting as a neurosurgeon for Catheryn LOISE Slocumb, MD.,have documented all relevant documentation on the behalf of Catheryn LOISE Slocumb, MD,as directed by  Catheryn LOISE Slocumb, MD while in the presence of Catheryn LOISE Slocumb, MD.  Subjective:    Patient ID: Kathleen Donovan , female    DOB: Sep 01, 1961 , 64 y.o.   MRN: [id number]  Chief Complaint  Patient presents with   Annual Exam    She is here today for a full physical examination. She is followed by Madolyn Monte, PA for her pelvic exams. She currently does not take any prescribed medications.  She states her GYN trying to process a order for bone density. She has yet to hear anything.     HPI Discussed the use of AI scribe software for clinical note transcription with the patient, who gave verbal consent to proceed.  History of Present Illness Kathleen Donovan is a 64 year old female with celiac disease who presents for an annual physical exam.  She manages her celiac disease well but recently experienced symptoms after consuming a dish containing wine that may have been contaminated with gluten. This led to burning sensations in her face and scalp, and joint pain, particularly in her thumb. These symptoms typically last for three to four days. She is very cautious about gluten exposure, noting that even minor contamination can trigger symptoms.  She uses ibuprofen  for joint pain and occasionally uses Flonase for esophagitis, which she experiences rarely due to her strict avoidance of gluten. She describes her symptoms as a burning sensation when exposed to gluten.  She has a Mirena  IUD, which was inserted in February of the previous year, and reports minimal spotting with it, describing it as 'a spot'.  She is closely followed by GYN.   She exercises five to six times a week, primarily through walking and housework. No chest pain or shortness of breath during physical activity. She maintains regular bowel movements as long as she  avoids flour.      Past Medical History:  Diagnosis Date   Dysfunctional uterine bleeding    Fibroids    uterine   Seasonal allergies    Varicose veins    BOTH LEGS     Family History  Problem Relation Age of Onset   Ovarian cancer Mother    Prostate cancer Father    Hypertension Sister    Asthma Sister    Obesity Sister      Current Outpatient Medications:    acyclovir  ointment (ZOVIRAX ) 5 %, Apply 1 Application topically every 3 (three) hours., Disp: 30 g, Rfl: 1   cholecalciferol (VITAMIN D ) 1000 units tablet, Take 2,000 Units by mouth daily. , Disp: , Rfl:    levocetirizine (XYZAL) 5 MG tablet, 5 mg., Disp: , Rfl:    levonorgestrel  (MIRENA ) 20 MCG/DAY IUD, 1 each by Intrauterine route once., Disp: , Rfl:    Multiple Vitamins-Minerals (CENTRUM SILVER 50+WOMEN PO), , Disp: , Rfl:    Probiotic Product (PROBIOTIC PO), Take by mouth. Ultra Flora 1 per day, Disp: , Rfl:    Allergies  Allergen Reactions   Gluten Meal     ITCHING, RASH, STOMACH UPSET   Latex     ITCHING      The patient states she uses IUD for birth control. No LMP recorded. Patient is perimenopausal.. Negative for Dysmenorrhea. Negative for: breast discharge, breast lump(s), breast pain and breast self exam. Associated symptoms include abnormal vaginal bleeding. Pertinent negatives include  abnormal bleeding (hematology), anxiety, decreased libido, depression, difficulty falling sleep, dyspareunia, history of infertility, nocturia, sexual dysfunction, sleep disturbances, urinary incontinence, urinary urgency, vaginal discharge and vaginal itching. Diet regular.The patient states her exercise level is  moderate.  . The patient's tobacco use is: Tobacco Use History[1]. She has been exposed to passive smoke. The patient's alcohol use is:  Social History   Substance and Sexual Activity  Alcohol Use Yes   Comment: rarely    Review of Systems  Constitutional: Negative.   HENT: Negative.    Eyes: Negative.    Respiratory: Negative.    Cardiovascular: Negative.   Gastrointestinal: Negative.   Endocrine: Negative.   Genitourinary: Negative.   Musculoskeletal:  Positive for arthralgias.  Skin: Negative.   Allergic/Immunologic: Negative.   Neurological: Negative.   Hematological: Negative.   Psychiatric/Behavioral: Negative.       Today's Vitals   12/27/24 0857  BP: 122/80  Pulse: 86  Temp: 98.3 F (36.8 C)  SpO2: 98%  Weight: 186 lb (84.4 kg)  Height: 5' 1 (1.549 m)   Body mass index is 35.14 kg/m.  Wt Readings from Last 3 Encounters:  12/27/24 186 lb (84.4 kg)  12/26/23 179 lb 3.2 oz (81.3 kg)  03/29/23 176 lb 9.6 oz (80.1 kg)     Objective:  Physical Exam Vitals and nursing note reviewed.  Constitutional:      Appearance: Normal appearance.  HENT:     Head: Normocephalic and atraumatic.     Right Ear: Tympanic membrane, ear canal and external ear normal.     Left Ear: Tympanic membrane, ear canal and external ear normal.     Nose: Nose normal.     Mouth/Throat:     Mouth: Mucous membranes are moist.     Pharynx: Oropharynx is clear.  Eyes:     Extraocular Movements: Extraocular movements intact.     Conjunctiva/sclera: Conjunctivae normal.     Pupils: Pupils are equal, round, and reactive to light.  Cardiovascular:     Rate and Rhythm: Normal rate and regular rhythm.     Pulses: Normal pulses.     Heart sounds: Normal heart sounds.  Pulmonary:     Effort: Pulmonary effort is normal.     Breath sounds: Normal breath sounds.  Chest:  Breasts:    Tanner Score is 5.     Right: Normal.     Left: Normal.     Comments: Dense breast tissue  Abdominal:     General: Abdomen is flat. Bowel sounds are normal.     Palpations: Abdomen is soft.  Genitourinary:    Comments: deferred Musculoskeletal:        General: No tenderness. Normal range of motion.     Cervical back: Normal range of motion and neck supple.  Skin:    General: Skin is warm and dry.      Comments: Hypopigmented areas on lower extremities  Neurological:     General: No focal deficit present.     Mental Status: She is alert and oriented to person, place, and time.  Psychiatric:        Mood and Affect: Mood normal.        Behavior: Behavior normal.         Assessment And Plan:     Encounter for general adult medical examination w/o abnormal findings Assessment & Plan: A full exam was performed.  Importance of monthly self breast exams was discussed with the patient.  She is advised to  get 30-45 minutes of regular exercise, no less than four to five days per week. Both weight-bearing and aerobic exercises are recommended.  She is advised to follow a healthy diet with at least six fruits/veggies per day, decrease intake of red meat and other saturated fats and to increase fish intake to twice weekly.  Meats/fish should not be fried -- baked, boiled or broiled is preferable. It is also important to cut back on your sugar intake.  Be sure to read labels - try to avoid anything with added sugar, high fructose corn syrup or other sweeteners.  If you must use a sweetener, you can try stevia or monkfruit.  It is also important to avoid artificially sweetened foods/beverages and diet drinks. Lastly, wear SPF 50 sunscreen on exposed skin and when in direct sunlight for an extended period of time.  Be sure to avoid fast food restaurants and aim for at least 60 ounces of water daily.      Orders: -     CMP14+EGFR -     CBC -     Lipid panel -     Hemoglobin A1c -     TSH  Idiopathic guttate hypomelanosis Assessment & Plan: Harmless, small, flat white spots from sun exposure and aging. - No treatment needed at this time.  - Use sunscreen   Other orders -     Levonorgestrel ; 1 each by Intrauterine route once.  Adult Wellness Visit Routine visit with no new concerns. Discussed work-life balance, retirement plans, exercise, weight management, cardiac calcium score, family history of  heart disease, cholesterol levels, and anemia of chronic disease. - Rechecked cholesterol levels. - Ordered A1c test. - Discussed cardiac calcium score in MyChart. - Encouraged continued exercise routine.   Return in 1 year (on 12/27/2025), or WTM. Patient was given opportunity to ask questions. Patient verbalized understanding of the plan and was able to repeat key elements of the plan. All questions were answered to their satisfaction.   I, Catheryn LOISE Slocumb, MD, have reviewed all documentation for this visit. The documentation on 12/27/24 for the exam, diagnosis, procedures, and orders are all accurate and complete.      [1]  Social History Tobacco Use  Smoking Status Never  Smokeless Tobacco Never

## 2024-12-28 LAB — CMP14+EGFR
ALT: 13 IU/L (ref 0–32)
AST: 20 IU/L (ref 0–40)
Albumin: 3.9 g/dL (ref 3.9–4.9)
Alkaline Phosphatase: 66 IU/L (ref 49–135)
BUN/Creatinine Ratio: 10 — ABNORMAL LOW (ref 12–28)
BUN: 10 mg/dL (ref 8–27)
Bilirubin Total: 0.6 mg/dL (ref 0.0–1.2)
CO2: 23 mmol/L (ref 20–29)
Calcium: 9.1 mg/dL (ref 8.7–10.3)
Chloride: 104 mmol/L (ref 96–106)
Creatinine, Ser: 1.02 mg/dL — ABNORMAL HIGH (ref 0.57–1.00)
Globulin, Total: 2.7 g/dL (ref 1.5–4.5)
Glucose: 83 mg/dL (ref 70–99)
Potassium: 4.5 mmol/L (ref 3.5–5.2)
Sodium: 141 mmol/L (ref 134–144)
Total Protein: 6.6 g/dL (ref 6.0–8.5)
eGFR: 61 mL/min/1.73

## 2024-12-28 LAB — HEMOGLOBIN A1C
Est. average glucose Bld gHb Est-mCnc: 111 mg/dL
Hgb A1c MFr Bld: 5.5 % (ref 4.8–5.6)

## 2024-12-28 LAB — LIPID PANEL
Chol/HDL Ratio: 2.7 ratio (ref 0.0–4.4)
Cholesterol, Total: 182 mg/dL (ref 100–199)
HDL: 68 mg/dL
LDL Chol Calc (NIH): 100 mg/dL — ABNORMAL HIGH (ref 0–99)
Triglycerides: 74 mg/dL (ref 0–149)
VLDL Cholesterol Cal: 14 mg/dL (ref 5–40)

## 2024-12-28 LAB — CBC
Hematocrit: 43.2 % (ref 34.0–46.6)
Hemoglobin: 13.7 g/dL (ref 11.1–15.9)
MCH: 29.2 pg (ref 26.6–33.0)
MCHC: 31.7 g/dL (ref 31.5–35.7)
MCV: 92 fL (ref 79–97)
Platelets: 281 x10E3/uL (ref 150–450)
RBC: 4.69 x10E6/uL (ref 3.77–5.28)
RDW: 11.8 % (ref 11.7–15.4)
WBC: 4.4 x10E3/uL (ref 3.4–10.8)

## 2024-12-28 LAB — TSH: TSH: 1.26 u[IU]/mL (ref 0.450–4.500)

## 2024-12-30 DIAGNOSIS — L818 Other specified disorders of pigmentation: Secondary | ICD-10-CM | POA: Insufficient documentation

## 2024-12-30 NOTE — Assessment & Plan Note (Signed)
 Harmless, small, flat white spots from sun exposure and aging. - No treatment needed at this time.  - Use sunscreen

## 2025-01-07 ENCOUNTER — Ambulatory Visit: Payer: Self-pay | Admitting: Internal Medicine

## 2025-03-12 ENCOUNTER — Encounter

## 2025-03-12 DIAGNOSIS — Z1231 Encounter for screening mammogram for malignant neoplasm of breast: Secondary | ICD-10-CM

## 2025-08-20 ENCOUNTER — Ambulatory Visit (HOSPITAL_BASED_OUTPATIENT_CLINIC_OR_DEPARTMENT_OTHER)

## 2026-01-01 ENCOUNTER — Encounter: Payer: Self-pay | Admitting: Internal Medicine
